# Patient Record
Sex: Male | Born: 1980 | Race: Asian | Hispanic: No | Marital: Single
Health system: Midwestern US, Community
[De-identification: ages and names within clinical notes are randomized; demographics above are authoritative.]

## PROBLEM LIST (undated history)

## (undated) DIAGNOSIS — I10 Essential (primary) hypertension: Secondary | ICD-10-CM

## (undated) DIAGNOSIS — E7800 Pure hypercholesterolemia, unspecified: Secondary | ICD-10-CM

---

## 2016-09-11 ENCOUNTER — Ambulatory Visit: Admit: 2016-09-11 | Discharge: 2016-09-11 | Payer: BLUE CROSS/BLUE SHIELD | Attending: Family Medicine

## 2016-09-11 DIAGNOSIS — I10 Essential (primary) hypertension: Secondary | ICD-10-CM

## 2016-09-11 MED ORDER — CIPROFLOXACIN-HYDROCORTISONE 0.2 %-1 % EAR DROPS, SUSP
Freq: Two times a day (BID) | OTIC | 0 refills | Status: AC
Start: 2016-09-11 — End: ?

## 2016-09-11 MED ORDER — LISINOPRIL-HYDROCHLOROTHIAZIDE 10 MG-12.5 MG TAB
ORAL_TABLET | Freq: Every day | ORAL | 5 refills | Status: AC
Start: 2016-09-11 — End: ?

## 2016-09-11 MED ORDER — SIMVASTATIN 20 MG TAB
20 mg | ORAL_TABLET | Freq: Every evening | ORAL | 5 refills | Status: AC
Start: 2016-09-11 — End: ?

## 2016-09-11 NOTE — Progress Notes (Signed)
HISTORY OF PRESENT ILLNESS  Mark Gibbs is a 35 y.o. male.  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   HPI    Review of Systems   Constitutional: Negative.  Negative for chills, fever, malaise/fatigue and weight loss.   HENT: Positive for ear pain. Negative for congestion, ear discharge, hearing loss, nosebleeds, sinus pain, sore throat and tinnitus.    Eyes: Negative for blurred vision, double vision, photophobia, pain and redness.   Respiratory: Negative for cough, hemoptysis, sputum production, shortness of breath, wheezing and stridor.    Cardiovascular: Negative for chest pain, palpitations, orthopnea, claudication, leg swelling and PND.   Gastrointestinal: Negative for abdominal pain, blood in stool, constipation, diarrhea, heartburn, melena, nausea and vomiting.   Genitourinary: Negative for dysuria, flank pain, frequency, hematuria and urgency.   Musculoskeletal: Negative for back pain, falls, joint pain, myalgias and neck pain.   Skin: Negative.  Negative for rash.   Neurological: Negative for dizziness, tingling, tremors, sensory change, speech change, focal weakness, seizures, loss of consciousness, weakness and headaches.   Endo/Heme/Allergies: Negative for polydipsia. Does not bruise/bleed easily.   Psychiatric/Behavioral: Negative for depression, hallucinations, memory loss, substance abuse and suicidal ideas. The patient is not nervous/anxious and does not have insomnia.        Physical Exam   Constitutional: He is oriented to person, place, and time. He appears well-developed. No distress.   HENT:    Head: Normocephalic and atraumatic.   Right Ear: External ear normal.   Left Ear: External ear normal.   Nose: Nose normal.   Mouth/Throat: Oropharynx is clear and moist.   External auditory canals are both congested using Q-tip mild otitis externa bilateral   Eyes: EOM are normal. Pupils are equal, round, and reactive to light. Left eye exhibits no discharge. No scleral icterus.   Neck: Normal range of motion. No JVD present. No tracheal deviation present. No thyromegaly present.   Cardiovascular: Normal rate and normal heart sounds.  Exam reveals no friction rub.    No murmur heard.  Pulmonary/Chest: Breath sounds normal. No respiratory distress. He has no wheezes. He has no rales. He exhibits no tenderness.   Abdominal: Bowel sounds are normal. He exhibits no distension and no mass. There is no tenderness. There is no rebound and no guarding.   Genitourinary: No penile tenderness.   Musculoskeletal: Normal range of motion. He exhibits no edema, tenderness or deformity.   Lymphadenopathy:     He has no cervical adenopathy.   Neurological: He is alert and oriented to person, place, and time. No cranial nerve deficit. He exhibits normal muscle tone. Coordination normal.   Skin: No rash noted. No erythema. No pallor.   Psychiatric: He has a normal mood and affect. His behavior is normal. Judgment and thought content normal.   Nursing note and vitals reviewed.      ASSESSMENT and PLAN multiple medical problems as below started lisinopril hydrochlorothiazide discussed risk factor management for coronary artery disease aspirin for chemoprophylaxis, optimize blood pressure cholesterol and LDL less than 130 diet exercise BMI 26, increase  fluids bed to table his exercise, , mild otitis externa advised against using Q-tips otic drops as below, flu shot today, he is in general good health to schedule physical lab test declined today,     ICD-10-CM ICD-9-CM    1. Hypertension, benign I10 401.1 lisinopril-hydroCHLOROthiazide  (PRINZIDE, ZESTORETIC) 10-12.5 mg per tablet   2. Hyperlipidemia, unspecified hyperlipidemia type E78.5 272.4 simvastatin (ZOCOR) 20 mg tablet   3. Over weight E66.3 278.02    4. Acute otitis externa of both ears, unspecified type H60.503 380.10 ciprofloxacin-hydrocortisone (CIPRO HC OTIC) otic suspension   5. Encounter for immunization Z23 V03.89 INFLUENZA, INJECTABLE, MDCK, PRESERVATIVE FREE, QUADRIVALENT      PR IMMUNIZ ADMIN,1 SINGLE/COMB VAC/TOXOID

## 2016-09-11 NOTE — Patient Instructions (Signed)
Take one aspirin every day 81 mg a day, can reduce risk of heart attack strokes, colon cancer .      Increase fruits vegetables, fiber, whole grains, beans, exercise,     once a day multivitamin such as Centrum silver store brand, due to benefit of folic acid vitamin D, has already mineral vitamin is recommended doses.  Multiple different vitamins not recommended may carry increased risk, including vitamin E

## 2016-09-11 NOTE — Progress Notes (Signed)
FLU VACCINE CONSENT    Patient received flu vaccine: accepted  Patient gives verbal consent: YES  Do you have Serious allergy to eggs: NO  Allergies reviewed: YES  Have you ever had a serious adverse reaction to Flu vaccine: NO  Have you ever had Guillain-Barre Syndrome (a type of temporary severe muscle weakness) within 6 weeks of receiving a flu vaccine: NO

## 2016-09-14 NOTE — Telephone Encounter (Signed)
Mr. Mark Gibbs has a question regarding the difference in dosage, he use to get Lisinopril 20mg  and the one he just got is 10/12.5 would like a call back on why.

## 2021-07-28 ENCOUNTER — Encounter: Payer: Self-pay | Admitting: Emergency Medicine

## 2021-07-28 ENCOUNTER — Ambulatory Visit
Admission: EM | Admit: 2021-07-28 | Discharge: 2021-07-28 | Disposition: A | Discharge status: Home / Self Care | Payer: BC Managed Care – PPO

## 2021-07-28 ENCOUNTER — Other Ambulatory Visit: Payer: Self-pay

## 2021-07-28 DIAGNOSIS — T464X5A Adverse effect of angiotensin-converting-enzyme inhibitors, initial encounter: Secondary | ICD-10-CM | POA: Diagnosis not present

## 2021-07-28 DIAGNOSIS — T783XXA Angioneurotic edema, initial encounter: Secondary | ICD-10-CM | POA: Diagnosis not present

## 2021-07-28 HISTORY — DX: Essential (primary) hypertension: I10

## 2021-07-28 HISTORY — DX: Pure hypercholesterolemia, unspecified: E78.00

## 2021-07-28 MED ORDER — PREDNISONE 20 MG PO TABS: 40.0000 mg | ORAL_TABLET | Freq: Every day | ORAL | 0 refills | Status: AC

## 2021-07-28 MED ORDER — AMLODIPINE BESYLATE 5 MG PO TABS: 5.0000 mg | ORAL_TABLET | Freq: Every day | ORAL | 0 refills | Status: DC

## 2021-07-28 NOTE — Discharge Instructions (Addendum)
Stop lisinopril Switch to amlodipine daily for blood pressure Follow-up with primary care Begin prednisone 40 mg daily to help with swelling Cool compresses to lip Please ensure swelling diminishing over the next 48 hours Please go to emergency room if developing worsening swelling, tongue throat swelling or any difficulty breathing

## 2021-07-28 NOTE — ED Provider Notes (Signed)
[facility]    CSN: [id number] Arrival date & time: 07/28/21  1408      History   Chief Complaint Chief Complaint  Patient presents with   Allergic Reaction    HPI Albert Davis is a 40 y.o. male history of hypertension, hyperlipidemia presenting today for evaluation of lip swelling.  Reports that this morning he ate a protein shake and took his daily lisinopril and statin and 1 hour later began to develop upper lip swelling which has progressed to encompass the entire lip.  Denies any oral, tongue or throat swelling.  Denies difficulty breathing.  Denies associated pain or itching associated with swelling.  Denies other rash.  Reports protein shake this morning he has taken before without problem.  Has been on lisinopril for approximately 6 to 7 years.  HPI  Past Medical History:  Diagnosis Date   Hypercholesteremia    Hypertension     There are no problems to display for this patient.   History reviewed. No pertinent surgical history.     Home Medications    Prior to Admission medications   Medication Sig Start Date End Date Taking? Authorizing Provider  amLODipine (NORVASC) 5 MG tablet Take 1 tablet (5 mg total) by mouth daily. 07/28/21  Yes Vaidehi Braddy C, PA-C  atorvastatin (LIPITOR) 10 MG tablet Take 10 mg by mouth daily.   Yes [provider]  predniSONE (DELTASONE) 20 MG tablet Take 2 tablets (40 mg total) by mouth daily with breakfast for 5 days. 07/28/21 08/02/21 Yes Jatinder Mcdonagh, Junius Creamer, PA-C    Family History Family History  Problem Relation Age of Onset   Healthy Mother    Healthy Father     Social History Social History   Tobacco Use   Smoking status: Every Day    Packs/day: 0.50    Types: Cigarettes   Smokeless tobacco: Never  Substance Use Topics   Alcohol use: Yes    Comment: couple of times per week   Drug use: Never     Allergies   Lisinopril   Review of Systems Review of Systems  Constitutional:  Negative for  fatigue and fever.  HENT:  Positive for facial swelling.   Eyes:  Negative for redness, itching and visual disturbance.  Respiratory:  Negative for shortness of breath.   Cardiovascular:  Negative for chest pain and leg swelling.  Gastrointestinal:  Negative for nausea and vomiting.  Musculoskeletal:  Negative for arthralgias and myalgias.  Skin:  Negative for color change, rash and wound.  Neurological:  Negative for dizziness, syncope, weakness, light-headedness and headaches.    Physical Exam Triage Vital Signs ED Triage Vitals  Enc Vitals Group     BP 07/28/21 1447 (!) 136/100     Pulse Rate 07/28/21 1447 (!) 104     Resp 07/28/21 1447 18     Temp 07/28/21 1447 98.2 F (36.8 C)     Temp Source 07/28/21 1447 Oral     SpO2 07/28/21 1447 98 %     Weight --      Height --      Head Circumference --      Peak Flow --      Pain Score 07/28/21 1448 4     Pain Loc --      Pain Edu? --      Excl. in GC? --    No data found.  Updated Vital Signs BP (!) 136/100 (BP Location: Right Arm)   Pulse Marland Kitchen)  104   Temp 98.2 F (36.8 C) (Oral)   Resp 18   SpO2 98%   Visual Acuity Right Eye Distance:   Left Eye Distance:   Bilateral Distance:    Right Eye Near:   Left Eye Near:    Bilateral Near:     Physical Exam Vitals and nursing note reviewed.  Constitutional:      Appearance: He is well-developed.     Comments: No acute distress  HENT:     Head: Normocephalic and atraumatic.     Nose: Nose normal.     Mouth/Throat:     Comments: Upper lip swelling, no involvement of lower lip, no other facial swelling noted, no soft palate swelling, uvula midline without swelling, posterior pharynx patent, no tongue swelling Eyes:     Conjunctiva/sclera: Conjunctivae normal.  Cardiovascular:     Rate and Rhythm: Normal rate.  Pulmonary:     Effort: Pulmonary effort is normal. No respiratory distress.     Comments: Breathing comfortably at rest, CTABL, no wheezing, rales or other  adventitious sounds auscultated  Speaking in full sentences Abdominal:     General: There is no distension.  Musculoskeletal:        General: Normal range of motion.     Cervical back: Neck supple.  Skin:    General: Skin is warm and dry.  Neurological:     Mental Status: He is alert and oriented to person, place, and time.     UC Treatments / Results  Labs (all labs ordered are listed, but only abnormal results are displayed) Labs Reviewed - No data to display  EKG   Radiology No results found.  Procedures Procedures (including critical care time)  Medications Ordered in UC Medications - No data to display  Initial Impression / Assessment and Plan / UC Course  I have reviewed the triage vital signs and the nursing notes.  Pertinent labs & imaging results that were available during my care of the patient were reviewed by me and considered in my medical decision making (see chart for details).     Suspect likely ACE inhibitor induced angioedema-likely bradykinin pathway versus histamine/mast cell.  Will provide prednisone to be safe, but discussed with patient stopping ACE inhibitor, switch to amlodipine as alternative for hypertension and monitor for gradual resolution over time.  No further airway involvement or airway compromise at this time.  Patient instructed to go to emergency room if developing the symptoms.  Discussed strict return precautions. Patient verbalized understanding and is agreeable with plan.  Final Clinical Impressions(s) / UC Diagnoses   Final diagnoses:  Angioedema due to angiotensin converting enzyme inhibitor (ACE-I)     Discharge Instructions      Stop lisinopril Switch to amlodipine daily for blood pressure Follow-up with primary care Begin prednisone 40 mg daily to help with swelling Cool compresses to lip Please ensure swelling diminishing over the next 48 hours Please go to emergency room if developing worsening swelling, tongue  throat swelling or any difficulty breathing     ED Prescriptions     Medication Sig Dispense Auth. Provider   amLODipine (NORVASC) 5 MG tablet Take 1 tablet (5 mg total) by mouth daily. 90 tablet Damier Disano C, PA-C   predniSONE (DELTASONE) 20 MG tablet Take 2 tablets (40 mg total) by mouth daily with breakfast for 5 days. 10 tablet Christofer Shen, [place] C, PA-C      PDMP not reviewed this encounter.   Tyresse Jayson, Jordan C, PA-C 07/28/21  1555  

## 2021-07-28 NOTE — ED Triage Notes (Signed)
Patient presents to [facility] for evaluation of lip swelling after taking his lisinopril this omrning.  At this time, he denies any SOB, tingling or numbness in his mouth, throat closing sensation.  Does c/o right sided headache/

## 2022-01-07 ENCOUNTER — Other Ambulatory Visit: Payer: Self-pay

## 2022-01-07 ENCOUNTER — Encounter (HOSPITAL_BASED_OUTPATIENT_CLINIC_OR_DEPARTMENT_OTHER): Payer: Self-pay | Admitting: Family Medicine

## 2022-01-07 ENCOUNTER — Ambulatory Visit (INDEPENDENT_AMBULATORY_CARE_PROVIDER_SITE_OTHER): Payer: BC Managed Care – PPO | Admitting: Family Medicine

## 2022-01-07 DIAGNOSIS — G4700 Insomnia, unspecified: Secondary | ICD-10-CM | POA: Diagnosis not present

## 2022-01-07 DIAGNOSIS — F17200 Nicotine dependence, unspecified, uncomplicated: Secondary | ICD-10-CM

## 2022-01-07 DIAGNOSIS — I10 Essential (primary) hypertension: Secondary | ICD-10-CM

## 2022-01-07 DIAGNOSIS — N529 Male erectile dysfunction, unspecified: Secondary | ICD-10-CM | POA: Diagnosis not present

## 2022-01-07 DIAGNOSIS — E785 Hyperlipidemia, unspecified: Secondary | ICD-10-CM | POA: Diagnosis not present

## 2022-01-07 NOTE — Assessment & Plan Note (Signed)
We will continue with rosuvastatin at this time Will check lipid panel to assess control

## 2022-01-07 NOTE — Assessment & Plan Note (Signed)
Discussed potential causes and treatments Provided sleep hygiene guidelines handout and reviewed general measures with patient Can consider initiation of melatonin 3 mg to 5 mg nightly 30 minutes before bedtime Also feel that CBT would be beneficial for patient, does report that he is seeing a therapist currently, encouraged him to discuss this with them.  If needing referral to alternative therapist in order to treat, advised that he contact us

## 2022-01-07 NOTE — Assessment & Plan Note (Signed)
Currently smoking about 1 to 2 cigarettes/day, did smoke more in the past Discussed importance of smoking cessation, risk with continued smoking including its impact on chronic medical conditions as well as risk of developing future medical issues such as cancer, heart disease Continue to assess readiness to quit at future visits Can consider something such as Chantix to help with quitting, however would need to achieve better blood pressure control before initiation

## 2022-01-07 NOTE — Progress Notes (Signed)
New Patient Office Visit  Subjective:  Patient ID: Albert Davis, male    DOB: 1981/10/21  Age: 41 y.o. MRN: [id number]  CC:  Chief Complaint  Patient presents with   Establish Care    Prior PCP in [place]   Medication Refill    Patient presents to have medications refilled. He has no other acute concerns or complaints today    HPI Albert Davis is a 41 year old male presenting to establish in clinic.  Current concerns as outlined above.  Past medical history significant for hypertension, hyperlipidemia.  On PHQ-9, patient does indicate notable issues with sleep.  At the end of visit, patient also indicates concerns related to erectile dysfunction.  Hypertension: Reports that this was diagnosed many years ago, he thinks as a child.  At one point he was on lisinopril in the past, however had relatively recent issue with angioedema and this was discontinued.  Has been taking amlodipine now for at least 3 months, tolerating well.  Will check blood pressure occasionally at home, reports systolic readings in the [id number] to 140s.  Denies any current issues related to chest pain, headaches, lightheadedness or dizziness.  Hyperlipidemia: Was taking atorvastatin 1 point the past, now taking rosuvastatin.  Has been tolerating well, does not have any myalgias.  Insomnia: Indicates that this is been going on for couple years, began around time of pandemic.  Primarily reports issues with sleep initiation and indicates that he will at times have racing thoughts while trying to sleep at night.  Erectile dysfunction: Near conclusion of visit, patient did mention erectile dysfunction as an additional issue that he has been dealing with he has questions about.  Does report that his wife was diagnosed with cancer, has been responding well to treatment.  He does report that they were both undergoing counseling and therapy related to this life stressor.  Patient is originally from [place], [place].  Most recently he was living in [place] and moved to the area here in April 2022.  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  Outside of work he does enjoy going to the gym.  Past Medical History:  Diagnosis Date   Hypercholesteremia    Hypertension     History reviewed. No pertinent surgical history.  Family History  Problem Relation Age of Onset   Healthy Mother    Healthy Father     Social History   Socioeconomic History   Marital status: Married    Spouse name: Not on file   Number of children: Not on file   Years of education: Not on file   Highest education level: Not on file  Occupational History   Not on file  Tobacco Use   Smoking status: Every Day    Packs/day: 0.50    Types: Cigarettes   Smokeless tobacco: Never  Vaping Use   Vaping Use: Never used  Substance and Sexual Activity   Alcohol use: Yes    Alcohol/week: 3.0 - 4.0 standard drinks    Types: 3 - 4 Shots of liquor per week    Comment: 3-4 on weekends   Drug use: Never   Sexual activity: Not Currently  Other Topics Concern   Not on file  Social History Narrative   Not on file   Social Determinants of Health   Financial Resource Strain: Not on file  Food Insecurity: Not on file  Transportation Needs: Not on file  Physical Activity:  Not on file  Stress: Not on file  Social Connections: Not on file  Intimate Partner Violence: Not on file    Objective:   Today's Vitals: BP (!) 170/112    Pulse (!) 109    Ht 5\' 9"  (1.753 m)    Wt 160 lb (72.6 kg)    SpO2 98%    BMI 23.63 kg/m   Physical Exam  41 year old male in no acute distress Cardiovascular exam with regular rate and rhythm, no murmur appreciated Lungs clear to auscultation bilaterally  Assessment & Plan:   Problem List Items Addressed This Visit       Cardiovascular and Mediastinum   Primary hypertension    Blood pressure not at goal in office today Reports borderline control at home Will need to  continue to monitor closely: At present can continue with amlodipine, monitoring at home Recommend DASH diet, regular aerobic exercise If blood pressure continues to remain elevated, consider addition of additional medication to help control blood pressure      Relevant Medications   rosuvastatin (CRESTOR) 20 MG tablet   Other Relevant Orders   CBC with Differential/Platelet   Comprehensive metabolic panel   Hemoglobin A1c   TSH Rfx on Abnormal to Free T4     Other   Hyperlipidemia    We will continue with rosuvastatin at this time Will check lipid panel to assess control      Relevant Medications   rosuvastatin (CRESTOR) 20 MG tablet   Other Relevant Orders   Lipid panel   Insomnia    Discussed potential causes and treatments Provided sleep hygiene guidelines handout and reviewed general measures with patient Can consider initiation of melatonin 3 mg to 5 mg nightly 30 minutes before bedtime Also feel that CBT would be beneficial for patient, does report that he is seeing a therapist currently, encouraged him to discuss this with them.  If needing referral to alternative therapist in order to treat, advised that he contact us      Relevant Orders   TSH Rfx on Abnormal to Free T4   Erectile dysfunction    Mention to me at end of visit, did discuss that erectile dysfunction can have many causes including vascular, structural, psychogenic, medication, chronic health condition.  Initially, will check labs related to chronic medical conditions to assess current control Will need to discuss further at next visit, will complete questionnaire at next visit      Tobacco use disorder    Currently smoking about 1 to 2 cigarettes/day, did smoke more in the past Discussed importance of smoking cessation, risk with continued smoking including its impact on chronic medical conditions as well as risk of developing future medical issues such as cancer, heart disease Continue to assess  readiness to quit at future visits Can consider something such as Chantix to help with quitting, however would need to achieve better blood pressure control before initiation       Outpatient Encounter Medications as of 01/07/2022  Medication Sig   amLODipine (NORVASC) 5 MG tablet Take 1 tablet (5 mg total) by mouth daily.   rosuvastatin (CRESTOR) 20 MG tablet Take 20 mg by mouth daily.   [DISCONTINUED] atorvastatin (LIPITOR) 10 MG tablet Take 10 mg by mouth daily.   No facility-administered encounter medications on file as of 01/07/2022.   Spent 60 minutes on this patient encounter, including preparation, chart review, face-to-face counseling with patient and coordination of care, and documentation of encounter  Follow-up: Return in about  4 weeks (around 02/04/2022).  Plan for close follow-up in about 3 to 4 weeks to review labs, follow-up on chronic medical issues, discuss ED concerns  Sammye Staff J [place], MD

## 2022-01-07 NOTE — Patient Instructions (Signed)
°  Medication Instructions:  °Your physician recommends that you continue on your current medications as directed. Please refer to the Current Medication list given to you today. °--If you need a refill on any your medications before your next appointment, please call your pharmacy first. If no refills are authorized on file call the office.-- °Lab Work: °Your physician has recommended that you have lab work today: CBC, CMP, Lipid, A1C, and TSH °If you have labs (blood work) drawn today and your tests are completely normal, you will receive your results via MyChart message OR a phone call from our staff.  °Please ensure you check your voicemail in the event that you authorized detailed messages to be left on a delegated number. If you have any lab test that is abnormal or we need to change your treatment, we will call you to review the results. ° °Follow-Up: °Your next appointment:   °Your physician recommends that you schedule a follow-up appointment in: 3-4 WEEKS with Dr. de Cuba ° °You will receive a text message or e-mail with a link to a survey about your care and experience with us today! We would greatly appreciate your feedback!  ° °Thanks for letting us be apart of your health journey!!  °Primary Care and Sports Medicine  ° °Dr. Raymond de Cuba  ° °We encourage you to activate your patient portal called "MyChart".  Sign up information is provided on this After Visit Summary.  MyChart is used to connect with patients for Virtual Visits (Telemedicine).  Patients are able to view lab/test results, encounter notes, upcoming appointments, etc.  Non-urgent messages can be sent to your provider as well. To learn more about what you can do with MyChart, please visit --  [email or web address]   ° °

## 2022-01-07 NOTE — Assessment & Plan Note (Signed)
Mention to me at end of visit, did discuss that erectile dysfunction can have many causes including vascular, structural, psychogenic, medication, chronic health condition.  Initially, will check labs related to chronic medical conditions to assess current control Will need to discuss further at next visit, will complete questionnaire at next visit

## 2022-01-07 NOTE — Assessment & Plan Note (Signed)
Blood pressure not at goal in office today Reports borderline control at home Will need to continue to monitor closely: At present can continue with amlodipine, monitoring at home Recommend DASH diet, regular aerobic exercise If blood pressure continues to remain elevated, consider addition of additional medication to help control blood pressure

## 2022-01-08 LAB — CBC WITH DIFFERENTIAL/PLATELET
Basophils Absolute: 0.1 10*3/uL (ref 0.0–0.2)
Basos: 1 %
EOS (ABSOLUTE): 0 10*3/uL (ref 0.0–0.4)
Eos: 0 %
Hematocrit: 50.9 % (ref 37.5–51.0)
Hemoglobin: 18.1 g/dL — ABNORMAL HIGH (ref 13.0–17.7)
Immature Grans (Abs): 0.1 10*3/uL (ref 0.0–0.1)
Immature Granulocytes: 1 %
Lymphocytes Absolute: 2.2 10*3/uL (ref 0.7–3.1)
Lymphs: 18 %
MCH: 32 pg (ref 26.6–33.0)
MCHC: 35.6 g/dL (ref 31.5–35.7)
MCV: 90 fL (ref 79–97)
Monocytes Absolute: 1.2 10*3/uL — ABNORMAL HIGH (ref 0.1–0.9)
Monocytes: 10 %
Neutrophils Absolute: 8.5 10*3/uL — ABNORMAL HIGH (ref 1.4–7.0)
Neutrophils: 70 %
Platelets: 279 10*3/uL (ref 150–450)
RBC: 5.66 x10E6/uL (ref 4.14–5.80)
RDW: 11.9 % (ref 11.6–15.4)
WBC: 12.1 10*3/uL — ABNORMAL HIGH (ref 3.4–10.8)

## 2022-01-08 LAB — COMPREHENSIVE METABOLIC PANEL
ALT: 62 IU/L — ABNORMAL HIGH (ref 0–44)
AST: 53 IU/L — ABNORMAL HIGH (ref 0–40)
Albumin/Globulin Ratio: 1.5 (ref 1.2–2.2)
Albumin: 5.1 g/dL — ABNORMAL HIGH (ref 4.0–5.0)
Alkaline Phosphatase: 105 IU/L (ref 44–121)
BUN/Creatinine Ratio: 8 — ABNORMAL LOW (ref 9–20)
BUN: 7 mg/dL (ref 6–20)
Bilirubin Total: 0.7 mg/dL (ref 0.0–1.2)
CO2: 21 mmol/L (ref 20–29)
Calcium: 9.8 mg/dL (ref 8.7–10.2)
Chloride: 97 mmol/L (ref 96–106)
Creatinine, Ser: 0.88 mg/dL (ref 0.76–1.27)
Globulin, Total: 3.3 g/dL (ref 1.5–4.5)
Glucose: 86 mg/dL (ref 70–99)
Potassium: 4.4 mmol/L (ref 3.5–5.2)
Sodium: 138 mmol/L (ref 134–144)
Total Protein: 8.4 g/dL (ref 6.0–8.5)
eGFR: 112 mL/min/{1.73_m2} (ref >59)

## 2022-01-08 LAB — LIPID PANEL
Chol/HDL Ratio: 2.7 ratio (ref 0.0–5.0)
Cholesterol, Total: 248 mg/dL — ABNORMAL HIGH (ref 100–199)
HDL: 91 mg/dL (ref >39)
LDL Chol Calc (NIH): 139 mg/dL — ABNORMAL HIGH (ref 0–99)
Triglycerides: 104 mg/dL (ref 0–149)
VLDL Cholesterol Cal: 18 mg/dL (ref 5–40)

## 2022-01-08 LAB — HEMOGLOBIN A1C
Est. average glucose Bld gHb Est-mCnc: 117 mg/dL
Hgb A1c MFr Bld: 5.7 % — ABNORMAL HIGH (ref 4.8–5.6)

## 2022-01-08 LAB — TSH RFX ON ABNORMAL TO FREE T4: TSH: 2.78 u[IU]/mL (ref 0.450–4.500)

## 2022-01-09 ENCOUNTER — Telehealth (HOSPITAL_BASED_OUTPATIENT_CLINIC_OR_DEPARTMENT_OTHER): Payer: Self-pay

## 2022-01-09 DIAGNOSIS — R7401 Elevation of levels of liver transaminase levels: Secondary | ICD-10-CM

## 2022-01-09 MED ORDER — ROSUVASTATIN CALCIUM 20 MG PO TABS: 20.0000 mg | ORAL_TABLET | Freq: Every day | ORAL | 0 refills | Status: DC

## 2022-01-09 MED ORDER — AMLODIPINE BESYLATE 5 MG PO TABS: 5.0000 mg | ORAL_TABLET | Freq: Every day | ORAL | 0 refills | Status: DC

## 2022-01-09 NOTE — Telephone Encounter (Signed)
Patient is aware and agreeable to lab results and recommendations Will fax orders to [facility] for additional labs Order placed for RUQ [place].

## 2022-01-09 NOTE — Telephone Encounter (Signed)
-----   Message from Hosie Poisson [place], MD sent at 01/08/2022  8:07 AM EST ----- White blood cell count slightly elevated with corresponding slight increase in neutrophils, suspect this may be related to recent illness, white blood cell count can also be affected by smoking.  Would plan to recheck CBC around time of next office visit.  Electrolytes and kidney function are normal.  Liver enzymes are slightly elevated, will check for any viral causes or iron storage issues (diagnosis code: Transaminitis).  Will also check right upper quadrant ultrasound.  Thyroid function is normal.  Hemoglobin A1c which measures approximately 81-month average of blood sugars is slightly elevated at 5.7%.  This falls within "prediabetes" range.  This finding indicates an increased risk of developing diabetes in the future.  Primary recommendations are for lifestyle modifications including dietary changes and increase in weekly physical activity. Eventual goal should be started about 150 minutes/week of moderate intensity aerobic exercise.  Lipid panel with elevated total cholesterol and elevated "bad" cholesterol.  Would initially focus on lifestyle modifications to address cholesterol issues - including dietary changes such as incorporating fresh fruits and vegetables, lean protein in the diet and reducing consumption of red meats, saturated fats, processed foods.  Recommend regular physical activity as discussed above.  Can discuss lab results at follow-up visit.

## 2022-02-06 ENCOUNTER — Other Ambulatory Visit: Payer: BC Managed Care – PPO

## 2022-02-08 ENCOUNTER — Other Ambulatory Visit (HOSPITAL_BASED_OUTPATIENT_CLINIC_OR_DEPARTMENT_OTHER): Payer: Self-pay | Admitting: Family Medicine

## 2022-03-06 ENCOUNTER — Ambulatory Visit
Admission: RE | Admit: 2022-03-06 | Discharge: 2022-03-06 | Disposition: A | Discharge status: Home / Self Care | Payer: BC Managed Care – PPO | Source: Ambulatory Visit | Attending: Family Medicine | Admitting: Family Medicine

## 2022-03-06 DIAGNOSIS — R7401 Elevation of levels of liver transaminase levels: Secondary | ICD-10-CM

## 2022-03-06 DIAGNOSIS — R748 Abnormal levels of other serum enzymes: Secondary | ICD-10-CM | POA: Diagnosis not present

## 2022-03-11 ENCOUNTER — Other Ambulatory Visit (HOSPITAL_BASED_OUTPATIENT_CLINIC_OR_DEPARTMENT_OTHER): Payer: Self-pay | Admitting: Family Medicine

## 2022-04-11 ENCOUNTER — Emergency Department (HOSPITAL_COMMUNITY): Payer: BC Managed Care – PPO

## 2022-04-11 ENCOUNTER — Encounter (HOSPITAL_COMMUNITY): Payer: Self-pay

## 2022-04-11 ENCOUNTER — Emergency Department (HOSPITAL_COMMUNITY)
Admission: EM | Admit: 2022-04-11 | Discharge: 2022-04-11 | Disposition: A | Discharge status: Home / Self Care | Payer: BC Managed Care – PPO | Attending: Emergency Medicine | Admitting: Emergency Medicine

## 2022-04-11 ENCOUNTER — Other Ambulatory Visit: Payer: Self-pay

## 2022-04-11 DIAGNOSIS — I10 Essential (primary) hypertension: Secondary | ICD-10-CM | POA: Insufficient documentation

## 2022-04-11 DIAGNOSIS — R7989 Other specified abnormal findings of blood chemistry: Secondary | ICD-10-CM | POA: Diagnosis not present

## 2022-04-11 DIAGNOSIS — R569 Unspecified convulsions: Secondary | ICD-10-CM | POA: Insufficient documentation

## 2022-04-11 DIAGNOSIS — R7401 Elevation of levels of liver transaminase levels: Secondary | ICD-10-CM | POA: Insufficient documentation

## 2022-04-11 DIAGNOSIS — Z79899 Other long term (current) drug therapy: Secondary | ICD-10-CM | POA: Insufficient documentation

## 2022-04-11 DIAGNOSIS — D696 Thrombocytopenia, unspecified: Secondary | ICD-10-CM | POA: Diagnosis not present

## 2022-04-11 DIAGNOSIS — R55 Syncope and collapse: Secondary | ICD-10-CM | POA: Diagnosis not present

## 2022-04-11 DIAGNOSIS — E871 Hypo-osmolality and hyponatremia: Secondary | ICD-10-CM | POA: Diagnosis not present

## 2022-04-11 LAB — COMPREHENSIVE METABOLIC PANEL
ALT: 66 U/L — ABNORMAL HIGH (ref 0–44)
AST: 68 U/L — ABNORMAL HIGH (ref 15–41)
Albumin: 4.2 g/dL (ref 3.5–5.0)
Alkaline Phosphatase: 59 U/L (ref 38–126)
Anion gap: 11 (ref 5–15)
BUN: 6 mg/dL (ref 6–20)
CO2: 19 mmol/L — ABNORMAL LOW (ref 22–32)
Calcium: 8.8 mg/dL — ABNORMAL LOW (ref 8.9–10.3)
Chloride: 103 mmol/L (ref 98–111)
Creatinine, Ser: 1.13 mg/dL (ref 0.61–1.24)
GFR, Estimated: >60 mL/min (ref >60)
Glucose, Bld: 147 mg/dL — ABNORMAL HIGH (ref 70–99)
Potassium: 3.7 mmol/L (ref 3.5–5.1)
Sodium: 133 mmol/L — ABNORMAL LOW (ref 135–145)
Total Bilirubin: 1 mg/dL (ref 0.3–1.2)
Total Protein: 7.4 g/dL (ref 6.5–8.1)

## 2022-04-11 LAB — CBC WITH DIFFERENTIAL/PLATELET
Abs Immature Granulocytes: 0.02 10*3/uL (ref 0.00–0.07)
Basophils Absolute: 0 10*3/uL (ref 0.0–0.1)
Basophils Relative: 0 %
Eosinophils Absolute: 0 10*3/uL (ref 0.0–0.5)
Eosinophils Relative: 0 %
HCT: 46.3 % (ref 39.0–52.0)
Hemoglobin: 16.4 g/dL (ref 13.0–17.0)
Immature Granulocytes: 0 %
Lymphocytes Relative: 8 %
Lymphs Abs: 0.4 10*3/uL — ABNORMAL LOW (ref 0.7–4.0)
MCH: 32.5 pg (ref 26.0–34.0)
MCHC: 35.4 g/dL (ref 30.0–36.0)
MCV: 91.9 fL (ref 80.0–100.0)
Monocytes Absolute: 0.5 10*3/uL (ref 0.1–1.0)
Monocytes Relative: 10 %
Neutro Abs: 4.3 10*3/uL (ref 1.7–7.7)
Neutrophils Relative %: 82 %
Platelets: 135 10*3/uL — ABNORMAL LOW (ref 150–400)
RBC: 5.04 MIL/uL (ref 4.22–5.81)
RDW: 12 % (ref 11.5–15.5)
WBC: 5.3 10*3/uL (ref 4.0–10.5)
nRBC: 0 % (ref 0.0–0.2)

## 2022-04-11 LAB — MAGNESIUM: Magnesium: 2.4 mg/dL (ref 1.7–2.4)

## 2022-04-11 LAB — RAPID URINE DRUG SCREEN, HOSP PERFORMED
Amphetamines: NOT DETECTED
Barbiturates: NOT DETECTED
Benzodiazepines: NOT DETECTED
Cocaine: NOT DETECTED
Opiates: NOT DETECTED
Tetrahydrocannabinol: NOT DETECTED

## 2022-04-11 MED ORDER — LORAZEPAM 2 MG/ML IJ SOLN
1.0000 mg | Freq: Once | INTRAMUSCULAR | Status: AC
  Administered 2022-04-11: 1 mg via INTRAVENOUS
  Filled 2022-04-11: qty 1

## 2022-04-11 MED ORDER — LORAZEPAM 1 MG PO TABS: 1.0000 mg | ORAL_TABLET | Freq: Every day | ORAL | 0 refills | Status: AC | PRN

## 2022-04-11 MED ORDER — LORAZEPAM 2 MG/ML IJ SOLN
0.5000 mg | Freq: Once | INTRAMUSCULAR | Status: AC
  Administered 2022-04-11: 0.5 mg via INTRAVENOUS
  Filled 2022-04-11: qty 1

## 2022-04-11 NOTE — Discharge Instructions (Signed)
Like we discussed, below is the contact information for [facility] neurology.  Please give them a call as soon as possible to schedule an appointment for reevaluation.  It is likely that you had a seizure today.  Please refrain from driving a motor vehicle until you are reevaluated by [facility] neurology. ? ?I prescribed a medication called Ativan.  You can take this as needed for worsening insomnia or increasing stress.  I think that these 2 factors likely played a role in your seizure today.  Please do not drive a car when taking this.  Please do not operate a motor vehicle when taking this. ? ?I have attached information on seizures as well.  Please reference this with any questions. ? ?If you develop any new or worsening symptoms whatsoever please come back to the emergency department for immediate reevaluation. ?

## 2022-04-11 NOTE — ED Notes (Signed)
Patient tearful and when asked if he were suicidal he hesitated. He then explained that the arguments with his wife have been really intense because he went out of the country with a friend and she thinks they are having an affair. When asked again about suicidal thoughts, patient firmly stated no. ?

## 2022-04-11 NOTE — ED Notes (Signed)
Patient to CT scan

## 2022-04-11 NOTE — ED Notes (Signed)
RN reviewed discharge instructions with pt. Pt verbalized understanding and had no further questions. VSS upon discharge.  

## 2022-04-11 NOTE — ED Triage Notes (Signed)
Patient states he has been arguing with wife for the last couple days. Also states he had some shaking this morning, like he couldn't pour coffee in a cup. He had an episode this afternoon where he had full body shaking and bit his lip. Patient doesn't remember the incident. ?

## 2022-04-11 NOTE — ED Notes (Signed)
Patient returned from CT scan. Wife at bedside

## 2022-04-11 NOTE — ED Provider Notes (Signed)
?[facility] EMERGENCY DEPARTMENT ?Provider Note ? ? ?CSN: [id number] ?Arrival date & time: 04/11/22  1608 ? ?  ? ?History ? ?No chief complaint on file. ? ? ?Albert Davis is a 41 y.o. male. ? ?HPI ?Patient is a 41 year old male with a history of hypertension, hyperlipidemia, insomnia, who presents to the emergency department due to a witnessed tonic-clonic episode.  Patient states that over the past few months he and his wife have had significant marital issues.  He states that over the past few days this has worsened significantly and they have been fighting much more than normal causing him a lot of stress.  He states that he has "not slept at all for 3 days".  Earlier today he states that he was sitting down at the computer and lost consciousness.  He does not recall any of these events.  He was told by his wife that he was "shaking".  Patient notes a bite to his tongue.  Currently reports feeling anxious and fatigued but otherwise denies any other somatic complaints.  No headaches, neck pain, back pain, chest pain, shortness of breath, bowel/bladder incontinence.  States that he had a seizure once when he was about 41 years old but has never been on antiepileptics.  He states that he used to regularly drink alcohol but has not drank any alcohol for more than a month. ? ?His wife is now at bedside.  She states that she was initially in the kitchen when she heard a noise in the other room.  She states that she saw him slide off the couch and onto the floor.  He then had a witnessed tonic-clonic episode.  She saw a small amount of bleeding from his mouth from his tongue bite.  His symptoms then resolved and he appeared confused for a short period of time before EMS arrived. ?  ? ?Home Medications ?Prior to Admission medications   ?Medication Sig Start Date End Date Taking? Authorizing Provider  ?LORazepam (ATIVAN) 1 MG tablet Take 1 tablet (1 mg total) by mouth daily as needed for anxiety or sleep.  04/11/22  Yes Placido Sou, PA-C  ?amLODipine (NORVASC) 5 MG tablet TAKE 1 TABLET (5 MG TOTAL) BY MOUTH DAILY. 03/11/22   de [place], Buren Kos, MD  ?rosuvastatin (CRESTOR) 20 MG tablet TAKE 1 TABLET BY MOUTH EVERY DAY 03/11/22   de [place], Buren Kos, MD  ?   ? ?Allergies    ?Lisinopril   ? ?Review of Systems   ?Review of Systems  ?All other systems reviewed and are negative. ?Ten systems reviewed and are negative for acute change, except as noted in the HPI.   ?Physical Exam ?Updated Vital Signs ?BP (!) 130/96   Pulse 89   Temp 98.7 ?F (37.1 ?C) (Oral)   Resp 15   Ht 5\' 9"  (1.753 m)   Wt 72.6 kg   SpO2 100%   BMI 23.63 kg/m?  ?Physical Exam ?Vitals and nursing note reviewed.  ?Constitutional:   ?   General: He is not in acute distress. ?   Appearance: Normal appearance. He is not ill-appearing, toxic-appearing or diaphoretic.  ?HENT:  ?   Head: Normocephalic and atraumatic.  ?   Right Ear: External ear normal.  ?   Left Ear: External ear normal.  ?   Nose: Nose normal.  ?   Mouth/Throat:  ?   Mouth: Mucous membranes are moist.  ?   Pharynx: Oropharynx is clear. No oropharyngeal exudate or posterior  oropharyngeal erythema.  ?   Comments: Tongue bite noted to the left lateral tongue.  No active bleeding. ?Eyes:  ?   General: No scleral icterus.    ?   Right eye: No discharge.     ?   Left eye: No discharge.  ?   Extraocular Movements: Extraocular movements intact.  ?   Conjunctiva/sclera: Conjunctivae normal.  ?   Pupils: Pupils are equal, round, and reactive to light.  ?   Comments: PERRL. EOMI.  ?Cardiovascular:  ?   Rate and Rhythm: Normal rate and regular rhythm.  ?   Pulses: Normal pulses.  ?   Heart sounds: Normal heart sounds. No murmur heard. ?  No friction rub. No gallop.  ?Pulmonary:  ?   Effort: Pulmonary effort is normal. No respiratory distress.  ?   Breath sounds: Normal breath sounds. No stridor. No wheezing, rhonchi or rales.  ?Abdominal:  ?   General: Abdomen is flat.  ?   Tenderness: There is no  abdominal tenderness.  ?Musculoskeletal:     ?   General: Normal range of motion.  ?   Cervical back: Normal range of motion and neck supple. No tenderness.  ?Skin: ?   General: Skin is warm and dry.  ?Neurological:  ?   General: No focal deficit present.  ?   Mental Status: He is alert and oriented to person, place, and time.  ?   Comments: Patient is oriented to person, place, and time. Patient phonates in clear, complete, and coherent sentences. Finger to nose intact bilaterally with no visible signs of dysmetria. Strength is 5/5 in all four extremities. Distal sensation intact in all four extremities.  ?Psychiatric:     ?   Mood and Affect: Mood normal.     ?   Behavior: Behavior normal.  ? ?ED Results / Procedures / Treatments   ?Labs ?(all labs ordered are listed, but only abnormal results are displayed) ?Labs Reviewed  ?COMPREHENSIVE METABOLIC PANEL - Abnormal; Notable for the following components:  ?    Result Value  ? Sodium 133 (*)   ? CO2 19 (*)   ? Glucose, Bld 147 (*)   ? Calcium 8.8 (*)   ? AST 68 (*)   ? ALT 66 (*)   ? All other components within normal limits  ?CBC WITH DIFFERENTIAL/PLATELET - Abnormal; Notable for the following components:  ? Platelets 135 (*)   ? Lymphs Abs 0.4 (*)   ? All other components within normal limits  ?RAPID URINE DRUG SCREEN, HOSP PERFORMED  ?MAGNESIUM  ?ETHANOL  ? ?EKG ?None ? ?Radiology ?CT HEAD WO CONTRAST (5MM) ? ?Result Date: 04/11/2022 ?CLINICAL DATA:  Syncope EXAM: CT HEAD WITHOUT CONTRAST TECHNIQUE: Contiguous axial images were obtained from the base of the skull through the vertex without intravenous contrast. RADIATION DOSE REDUCTION: This exam was performed according to the departmental dose-optimization program which includes automated exposure control, adjustment of the mA and/or kV according to patient size and/or use of iterative reconstruction technique. COMPARISON:  None Available. FINDINGS: Brain: No evidence of acute infarction, hemorrhage,  hydrocephalus, extra-axial collection or mass lesion/mass effect. Vascular: No hyperdense vessel or unexpected calcification. Skull: Normal. Negative for fracture or focal lesion. Sinuses/Orbits: Mucosal thickening of the ethmoid sinuses. No acute finding. Other: None. IMPRESSION: No acute intracranial abnormality. Electronically Signed   By: Allegra LaiLeah  Strickland M.D.   On: 04/11/2022 16:53   ? ?Procedures ?Procedures  ? ?Medications Ordered in ED ?Medications  ?LORazepam (ATIVAN)  injection 1 mg (1 mg Intravenous Given 04/11/22 1750)  ?LORazepam (ATIVAN) injection 0.5 mg (0.5 mg Intravenous Given 04/11/22 2131)  ? ?ED Course/ Medical Decision Making/ A&P ?  ?                        ?Medical Decision Making ?Amount and/or Complexity of Data Reviewed ?Labs: ordered. ?Radiology: ordered. ? ?Risk ?Prescription drug management. ? ?Pt is a 41 y.o. male who presents to the emergency department with his wife due to a witnessed tonic-clonic episode that occurred prior to arrival. ? ?Labs: ?CBC with platelets of 135 and lymphocytes of 0.4. ?CMP with a sodium of 133, CO2 of 19, glucose of 147, calcium of 8.8, AST of 68, ALT of 66. ?Magnesium of 2.4. ?UDS without abnormalities. ? ?Imaging: ?CT scan of the head without contrast shows no acute intracranial abnormality. ? ?I, Placido Sou, PA-C, personally reviewed and evaluated these images and lab results as part of my medical decision-making. ? ?On my initial exam patient is A&O x3.  Speaking clearly and coherently.  Strength is 5/5 in all 4 extremities.  Distal sensation intact.  No gross deficits.  He does have a tongue bite to the left lateral tongue without any active bleeding.  Appears consistent with patient's wife's story.  Patient's history and physical exam findings today appears consistent with a likely seizure.  He does note that he used to drink alcohol but has not drank any alcohol for the past month.  No other drug use and UDS is negative.  Does not appear to be in  acute withdrawals at this time.  He notes that he had a previous seizure when he was a teenager but denies any recurrent seizures since then.  States he has never taken an antiepileptic medication before.  He states that he ha

## 2022-04-14 ENCOUNTER — Encounter: Payer: Self-pay | Admitting: Neurology

## 2022-04-20 ENCOUNTER — Ambulatory Visit (INDEPENDENT_AMBULATORY_CARE_PROVIDER_SITE_OTHER): Payer: BC Managed Care – PPO | Admitting: Neurology

## 2022-04-20 ENCOUNTER — Encounter: Payer: Self-pay | Admitting: Neurology

## 2022-04-20 VITALS — BP 139/96 | HR 81 | Ht 69.0 in | Wt 150.0 lb

## 2022-04-20 DIAGNOSIS — F4320 Adjustment disorder, unspecified: Secondary | ICD-10-CM | POA: Diagnosis not present

## 2022-04-20 DIAGNOSIS — R569 Unspecified convulsions: Secondary | ICD-10-CM

## 2022-04-20 DIAGNOSIS — F603 Borderline personality disorder: Secondary | ICD-10-CM | POA: Diagnosis not present

## 2022-04-20 DIAGNOSIS — Z7189 Other specified counseling: Secondary | ICD-10-CM | POA: Diagnosis not present

## 2022-04-20 DIAGNOSIS — F1010 Alcohol abuse, uncomplicated: Secondary | ICD-10-CM | POA: Diagnosis not present

## 2022-04-20 NOTE — Patient Instructions (Signed)
Good to meet you. ? ?Schedule open MRI brain with and without contrast. Once you have scheduled it, please let me know so we can send in the Valium to take before the test and schedule follow-up with me ? ?2. Schedule 1-hour EEG ? ?3. Continue working with therapist ? ?4. Follow-up after tests, call for any changes ? ? ?Seizure Precautions: ?1. If medication has been prescribed for you to prevent seizures, take it exactly as directed.  Do not stop taking the medicine without talking to your doctor first, even if you have not had a seizure in a long time.  ? ?2. Avoid activities in which a seizure would cause danger to yourself or to others.  Don't operate dangerous machinery, swim alone, or climb in high or dangerous places, such as on ladders, roofs, or girders.  Do not drive unless your doctor says you may. ? ?3. If you have any warning that you may have a seizure, lay down in a safe place where you can't hurt yourself.   ? ?4.  No driving for 6 months from last seizure, as per [facility].   Please refer to the following link on the [place] website for more information: http://www.epilepsyfoundation.org/answerplace/Social/driving/drivingu.cfm  ? ?5.  Maintain good sleep hygiene. Start weaning off alcohol. ? ?6.  Contact your doctor if you have any problems that may be related to the medicine you are taking. ? ?7.  Call 911 and bring the patient back to the ED if: ?      ? A.  The seizure lasts longer than 5 minutes.      ? B.  The patient doesn't awaken shortly after the seizure ? C.  The patient has new problems such as difficulty seeing, speaking or moving ? D.  The patient was injured during the seizure ? E.  The patient has a temperature over 102 F (39C) ? F.  The patient vomited and now is having trouble breathing ?      ? ?

## 2022-04-20 NOTE — Progress Notes (Signed)
? ?NEUROLOGY CONSULTATION NOTE ? ?Albert Davis ?MRN: [id number] ?DOB: 1981/12/05 ? ?Referring provider: Dr. Lennice Sites (ER) ?Primary care provider: Dr. Kyung Rudd de [place] ? ?Reason for consult:  new onset seizure ? ?Dear Dr Ronnald Nian: ? ?Thank you for your kind referral of Albert Davis for consultation of the above symptoms. Although his history is well known to you, please allow me to reiterate it for the purpose of our medical record. The patient was accompanied to the clinic by his wife Vidya who also provides collateral information. Records and images were personally reviewed where available. ? ? ?HISTORY OF PRESENT ILLNESS: ?This is a 41 year old right-handed man with a history of hypertension, anxiety, presenting for evaluation of seizure. He had one seizure at age 32 or 81, there was no prior warning, he recalls they were playing then his friend reported he was starting and unresponsive then just fell with shaking. He bit his tongue and vomited. He saw his PCP but was not evaluated by Neurology. He denies any further seizures until 04/11/22. He has been under significant stress and had not slept for almost 3 days. He was tired and stressed that morning but went to the store with no issues. They got home around 3pm then he remembers his neck/head getting forced to the left. He kept trying to force his head back then woke up to EMS around him. His wife reports he was sitting on the couch with his laptop, she was in the other room and heard him fall, he had slid to the floor stiff and shaking with his head turned to the left and his right arm extended. The seizure lasted 2-3 minutes, there was blood and saliva from his mouth, he had bitten the right side of his cheek and left side of his tongue. He was confused after, saying random things, no incontinence or focal weakness. He was brought to the ER where bloodwork showed Na 133, platelets 135, AST 68, ALT 68, UDS negative. I personally reviewed head CT without  contrast which did not show any acute changes. His wife reports that 2 days prior to the seizure, he was complaining of chills and hand tremors with fine movements. They deny any staring/unresponsive episodes, no other gaps in time, olfactory/gustatory hallucinations, deja vu, rising epigastric sensation, focal numbness/tingling/weakness, myoclonic jerks. He notes tremors sometimes when trying to use a spoon and food spilling. He has occasional chest heaviness with a poking sensation radiating to his back. He has a history of heavy alcohol use and has had "drunk blackouts" in the past. He states that his last alcohol intake was a week prior to the seizure. He has cut down on alcohol, drinking 4-5 beers on the weekends and he has started going to [place]. He was given lorazepam in the ER which he has been taking every night helping a little with sleep, he has been getting 5-6 hours at night. His wife is concerned that he is getting dependent on it for sleep. He states his mind does not rest, it is always running, overthinking relationships, his job, making him feel mentally exhausted for the past year. He denies any significant headaches, sometimes he feels a poking sensation on the left side of his head when he has not eaten. He notes some tingling/"tickling" in his head for a few minutes. No dizziness, diplopia, dysarthria/dysphagia, neck/back pain, bowel/bladder dysfunction. Memory is not as sharp but it does not affect work. He works as a Government social research officer. They have started seeing a  marriage Social worker.  ? ?Epilepsy Risk Factors:  His sister had a seizure at age 75. Otherwise he had a normal birth and early development.  There is no history of febrile convulsions, CNS infections such as meningitis/encephalitis, significant traumatic brain injury, neurosurgical procedures. ? ? ?PAST MEDICAL HISTORY: ?Past Medical History:  ?Diagnosis Date  ? Hypercholesteremia   ? Hypertension   ? ? ?PAST SURGICAL HISTORY: ?No past  surgical history on file. ? ?MEDICATIONS: ?Current Outpatient Medications on File Prior to Visit  ?Medication Sig Dispense Refill  ? amLODipine (NORVASC) 5 MG tablet TAKE 1 TABLET (5 MG TOTAL) BY MOUTH DAILY. 30 tablet 0  ? LORazepam (ATIVAN) 1 MG tablet Take 1 tablet (1 mg total) by mouth daily as needed for anxiety or sleep. 10 tablet 0  ? rosuvastatin (CRESTOR) 20 MG tablet TAKE 1 TABLET BY MOUTH EVERY DAY 30 tablet 0  ? ?No current facility-administered medications on file prior to visit.  ? ? ?ALLERGIES: ?Allergies  ?Allergen Reactions  ? Lisinopril Swelling  ? ? ?FAMILY HISTORY: ?Family History  ?Problem Relation Age of Onset  ? Healthy Mother   ? Healthy Father   ? ? ?SOCIAL HISTORY: ?Social History  ? ?Socioeconomic History  ? Marital status: Married  ?  Spouse name: Not on file  ? Number of children: Not on file  ? Years of education: Not on file  ? Highest education level: Not on file  ?Occupational History  ? Not on file  ?Tobacco Use  ? Smoking status: Every Day  ?  Packs/day: 0.50  ?  Types: Cigarettes  ? Smokeless tobacco: Never  ?Vaping Use  ? Vaping Use: Never used  ?Substance and Sexual Activity  ? Alcohol use: Yes  ?  Alcohol/week: 3.0 - 4.0 standard drinks  ?  Types: 3 - 4 Shots of liquor per week  ?  Comment: 3-4 on weekends  ? Drug use: Never  ? Sexual activity: Not Currently  ?Other Topics Concern  ? Not on file  ?Social History Narrative  ? RH   ? Lives with wife  ? One story  ? Caff rare  ? ?Social Determinants of Health  ? ?Financial Resource Strain: Not on file  ?Food Insecurity: Not on file  ?Transportation Needs: Not on file  ?Physical Activity: Not on file  ?Stress: Not on file  ?Social Connections: Not on file  ?Intimate Partner Violence: Not on file  ? ? ? ?PHYSICAL EXAM: ?Vitals:  ? 04/20/22 1306  ?BP: (!) 139/96  ?Pulse: 81  ?SpO2: 99%  ? ?General: No acute distress ?Head:  Normocephalic/atraumatic ?Skin/Extremities: No rash, no edema ?Neurological Exam: ?Mental status: alert and  oriented to person, place, and time, no dysarthria or aphasia, Fund of knowledge is appropriate.  Recent and remote memory are intact, 3/3 delayed recall.  Attention and concentration are normal, 5/5 WORLD backwards.  ?Cranial nerves: ?CN I: not tested ?CN II: pupils equal, round and reactive to light, visual fields intact ?CN III, IV, VI:  full range of motion, no nystagmus, no ptosis ?CN V: facial sensation intact ?CN VII: upper and lower face symmetric ?CN VIII: hearing intact to conversation ?Bulk & Tone: normal, no fasciculations. ?Motor: 5/5 throughout with no pronator drift. ?Sensation: intact to light touch, cold, pin, vibration sense.  No extinction to double simultaneous stimulation.  Romberg test negative ?Deep Tendon Reflexes: +2 throughout ?Cerebellar: no incoordination on finger to nose testing ?Gait: narrow-based and steady, able to tandem walk adequately. ?Tremor: none in  office today ? ? ?IMPRESSION: ?This is a 41 year old right-handed man with a history of hypertension, anxiety, presenting for evaluation of seizure. He had a seizure at age 31 or 7 with staring followed by convulsion. 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 MRI brain with and without contrast and 1-hour EEG will be ordered for seizure classification. Discussed recommendation to start seizure medication, Lamotrigine may be helpful for mood stabilization as well, they would like to complete tests first. Discussed that lorazepam should not be taken on a daily basis, offered Psychiatry referral for medication management of anxiety/insomnia, they would like to start with psychotherapy first. [place] driving laws were discussed with the patient, and he knows to stop driving after a seizure, until 6 months seizure-free. We discussed avoidance of seizure triggers, including weaning off alcohol. Follow-up after tests, call for any changes.   ? ? ?Thank you for allowing me to participate in the care of this patient. Please do not hesitate to call for any questions or concerns. ? ? ?Ellouise Newer, M.D. ? ?CC: Dr. [place], Dr. Ronnald Nian ? ?

## 2022-04-22 ENCOUNTER — Telehealth: Payer: Self-pay | Admitting: Neurology

## 2022-04-22 NOTE — Telephone Encounter (Signed)
[facility] called and stated they need a prior authorization for his MRI scheduled on May 26. ?

## 2022-04-23 ENCOUNTER — Telehealth: Payer: Self-pay | Admitting: Neurology

## 2022-04-23 NOTE — Telephone Encounter (Signed)
PA number is [id number] Berkley Harvey is good from 04/23/22 until 05/22/22

## 2022-04-23 NOTE — Telephone Encounter (Signed)
Joni Reining from [facility] called and left a message stating the MRI that has been scheduled for the patient needs PA. She saw it was done through the portal, but stated it cannot be one on the portal, it has to be done by calling [phone]. Their NPI is [id number].

## 2022-04-23 NOTE — Telephone Encounter (Addendum)
Per [employer] website no PA is needed for his MRI of the Brain with an with out contrast, [facility] was called an informed,

## 2022-04-26 ENCOUNTER — Other Ambulatory Visit (HOSPITAL_BASED_OUTPATIENT_CLINIC_OR_DEPARTMENT_OTHER): Payer: Self-pay | Admitting: Family Medicine

## 2022-04-27 ENCOUNTER — Other Ambulatory Visit (HOSPITAL_BASED_OUTPATIENT_CLINIC_OR_DEPARTMENT_OTHER): Payer: Self-pay | Admitting: Family Medicine

## 2022-04-27 DIAGNOSIS — Z608 Other problems related to social environment: Secondary | ICD-10-CM | POA: Diagnosis not present

## 2022-04-27 DIAGNOSIS — F603 Borderline personality disorder: Secondary | ICD-10-CM | POA: Diagnosis not present

## 2022-04-27 DIAGNOSIS — F1010 Alcohol abuse, uncomplicated: Secondary | ICD-10-CM | POA: Diagnosis not present

## 2022-04-27 DIAGNOSIS — Z7189 Other specified counseling: Secondary | ICD-10-CM | POA: Diagnosis not present

## 2022-04-29 ENCOUNTER — Ambulatory Visit (INDEPENDENT_AMBULATORY_CARE_PROVIDER_SITE_OTHER): Payer: BC Managed Care – PPO | Admitting: Neurology

## 2022-04-29 DIAGNOSIS — R569 Unspecified convulsions: Secondary | ICD-10-CM | POA: Diagnosis not present

## 2022-05-01 DIAGNOSIS — R569 Unspecified convulsions: Secondary | ICD-10-CM | POA: Diagnosis not present

## 2022-05-06 DIAGNOSIS — F1010 Alcohol abuse, uncomplicated: Secondary | ICD-10-CM | POA: Diagnosis not present

## 2022-05-06 DIAGNOSIS — F418 Other specified anxiety disorders: Secondary | ICD-10-CM | POA: Diagnosis not present

## 2022-05-06 DIAGNOSIS — Z7189 Other specified counseling: Secondary | ICD-10-CM | POA: Diagnosis not present

## 2022-05-06 DIAGNOSIS — F603 Borderline personality disorder: Secondary | ICD-10-CM | POA: Diagnosis not present

## 2022-05-06 NOTE — Procedures (Signed)
ELECTROENCEPHALOGRAM REPORT  Date of Study: 04/29/2022  Patient's Name: Albert Davis MRN: [id number] Date of Birth: 1981/04/04  Referring Provider: Dr. Patrcia Dolly  Clinical History: This is a 41 year old man seizure-free off medication for 25 years until 04/2022 with forced head deviation to the left. EEG for classification.  Medications: Norvasc Crestor  Technical Summary: A multichannel digital 1-hour EEG recording measured by the international 10-20 system with electrodes applied with paste and impedances below 5000 ohms performed in our laboratory with EKG monitoring in an awake and asleep patient.  Hyperventilation and photic stimulation were performed.  The digital EEG was referentially recorded, reformatted, and digitally filtered in a variety of bipolar and referential montages for optimal display.    Description: The patient is awake and asleep during the recording.  During maximal wakefulness, there is a symmetric, medium voltage 10 Hz posterior dominant rhythm that attenuates with eye opening.  The record is symmetric.  During drowsiness and stage I sleep, there is an increase in theta slowing of the background with vertex waves seen. Hyperventilation and photic stimulation did not elicit any abnormalities.  There were no epileptiform discharges or electrographic seizures seen.    EKG lead was unremarkable.  Impression: This 1-hour awake and asleep EEG is normal.    Clinical Correlation: A normal EEG does not exclude a clinical diagnosis of epilepsy.  If further clinical questions remain, prolonged EEG may be helpful.  Clinical correlation is advised.   Patrcia Dolly, M.D.

## 2022-05-12 ENCOUNTER — Ambulatory Visit (INDEPENDENT_AMBULATORY_CARE_PROVIDER_SITE_OTHER): Payer: BC Managed Care – PPO | Admitting: Neurology

## 2022-05-12 ENCOUNTER — Encounter: Payer: Self-pay | Admitting: Neurology

## 2022-05-12 VITALS — BP 142/98 | HR 98 | Ht 69.0 in | Wt 152.0 lb

## 2022-05-12 DIAGNOSIS — R569 Unspecified convulsions: Secondary | ICD-10-CM

## 2022-05-12 NOTE — Progress Notes (Signed)
NEUROLOGY FOLLOW UP OFFICE NOTE  Albert MolaJacob Vanyo [id number] 09/04/1981  HISTORY OF PRESENT ILLNESS: I had the pleasure of seeing Albert Davis in follow-up in the neurology clinic on 05/12/2022.  The patient was last seen 2 weeks ago for new onset seizure. He is again accompanied by his wife Vidya who helps supplement the history today.  Records and images were personally reviewed where available. He had a brain MRI with and without contrast on 5/26 which did not show any acute changes, hippocampi symmetric. There is note of mild generalized parenchymal volume loss, images unavailable for review. His 1-hour EEG was normal. He and his wife deny any further seizures or seizure-like symptoms since 04/11/22. No staring/unresponsive episodes, gaps in time, olfactory/gustatory hallucinations, focal numbness/tingling/weakness, myoclonic jerks. He reports feeling "heavy-headed," no headaches. He does not sleep, he gets "0 to 2 hours" and may take 10-15 minute naps then jerks awake. His mind is always restless. He drinks 4 beers 3-4 days a week (has been drinking heavily for 20 years), and he gets restless if he does not drink, leading to fights at home. They have started marriage counseling.     History on Initial Assessment 04/20/2022: This is a 41 year old right-handed man with a history of hypertension, anxiety, presenting for evaluation of seizure. He had one seizure at age 41 or [age], there was no prior warning, he recalls they were playing then his friend reported he was starting and unresponsive then just fell with shaking. He bit his tongue and vomited. He saw his PCP but was not evaluated by Neurology. He denies any further seizures until 04/11/22. He has been under significant stress and had not slept for almost 3 days. He was tired and stressed that morning but went to the store with no issues. They got home around 3pm then he remembers his neck/head getting forced to the left. He kept trying to force his head  back then woke up to EMS around him. His wife reports he was sitting on the couch with his laptop, she was in the other room and heard him fall, he had slid to the floor stiff and shaking with his head turned to the left and his right arm extended. The seizure lasted 2-3 minutes, there was blood and saliva from his mouth, he had bitten the right side of his cheek and left side of his tongue. He was confused after, saying random things, no incontinence or focal weakness. He was brought to the ER where bloodwork showed Na 133, platelets 135, AST 68, ALT 68, UDS negative. I personally reviewed head CT without contrast which did not show any acute changes. His wife reports that 2 days prior to the seizure, he was complaining of chills and hand tremors with fine movements. They deny any staring/unresponsive episodes, no other gaps in time, olfactory/gustatory hallucinations, deja vu, rising epigastric sensation, focal numbness/tingling/weakness, myoclonic jerks. He notes tremors sometimes when trying to use a spoon and food spilling. He has occasional chest heaviness with a poking sensation radiating to his back. He has a history of heavy alcohol use and has had "drunk blackouts" in the past. He states that his last alcohol intake was a week prior to the seizure. He has cut down on alcohol, drinking 4-5 beers on the weekends and he has started going to AA. He was given lorazepam in the ER which he has been taking every night helping a little with sleep, he has been getting 5-6 hours at night. His  wife is concerned that he is getting dependent on it for sleep. He states his mind does not rest, it is always running, overthinking relationships, his job, making him feel mentally exhausted for the past year. He denies any significant headaches, sometimes he feels a poking sensation on the left side of his head when he has not eaten. He notes some tingling/"tickling" in his head for a few minutes. No dizziness, diplopia,  dysarthria/dysphagia, neck/back pain, bowel/bladder dysfunction. Memory is not as sharp but it does not affect work. He works as a Emergency planning/management officer. They have started seeing a marriage counselor.   Epilepsy Risk Factors:  His sister had a seizure at age 52. Otherwise he had a normal birth and early development.  There is no history of febrile convulsions, CNS infections such as meningitis/encephalitis, significant traumatic brain injury, neurosurgical procedures.  Diagnostic Data: brain MRI with and without contrast on 5/26 did not show any acute changes, hippocampi symmetric. There is note of mild generalized parenchymal volume loss, images unavailable for review.   His 1-hour EEG 04/2022 was normal.  PAST MEDICAL HISTORY: Past Medical History:  Diagnosis Date   Hypercholesteremia    Hypertension     MEDICATIONS: Current Outpatient Medications on File Prior to Visit  Medication Sig Dispense Refill   amLODipine (NORVASC) 5 MG tablet TAKE 1 TABLET (5 MG TOTAL) BY MOUTH DAILY. 30 tablet 0   rosuvastatin (CRESTOR) 20 MG tablet TAKE 1 TABLET BY MOUTH EVERY DAY 30 tablet 0   LORazepam (ATIVAN) 1 MG tablet Take 1 tablet (1 mg total) by mouth daily as needed for anxiety or sleep. (Patient not taking: Reported on 05/12/2022) 10 tablet 0   No current facility-administered medications on file prior to visit.    ALLERGIES: Allergies  Allergen Reactions   Lisinopril Swelling    FAMILY HISTORY: Family History  Problem Relation Age of Onset   Healthy Mother    Healthy Father     SOCIAL HISTORY: Social History   Socioeconomic History   Marital status: Married    Spouse name: Not on file   Number of children: Not on file   Years of education: Not on file   Highest education level: Not on file  Occupational History   Not on file  Tobacco Use   Smoking status: Every Day    Types: Cigarettes   Smokeless tobacco: Never   Tobacco comments:    3 cigarettes a day  Vaping Use   Vaping  Use: Never used  Substance and Sexual Activity   Alcohol use: Yes    Alcohol/week: 3.0 - 4.0 standard drinks    Types: 3 - 4 Shots of liquor per week    Comment: 3-4 on weekends   Drug use: Never   Sexual activity: Not Currently  Other Topics Concern   Not on file  Social History Narrative   RH    Lives with wife   One story   Caff rare   Social Determinants of Health   Financial Resource Strain: Not on file  Food Insecurity: Not on file  Transportation Needs: Not on file  Physical Activity: Not on file  Stress: Not on file  Social Connections: Not on file  Intimate Partner Violence: Not on file     PHYSICAL EXAM: Vitals:   05/12/22 1100 05/12/22 1143  BP: (!) 146/102 (!) 142/98  Pulse: (!) 108 98  SpO2: 99%    General: No acute distress Head:  Normocephalic/atraumatic Skin/Extremities: No rash, no edema  Neurological Exam: alert and awake. No aphasia or dysarthria. Fund of knowledge is appropriate. Attention and concentration are normal.   Cranial nerves: Pupils equal, round. Extraocular movements intact. No facial asymmetry.  Motor: moves all extremities symmetrically. Gait narrow-based and steady, no ataxia.   IMPRESSION: This is a 41 yo RH man with a history of hypertension, anxiety, alcohol use, with focal seizure that secondarily generalized last 04/14/22. He had forced head deviation to the left prior to the GTC, suggestive of right hemisphere onset. We reviewed brain MRI images today, no acute changes seen. There is note of mild diffuse volume loss, likely due to chronic alcohol use. His 1-hour EEG is normal. We discussed option to start seizure medication or monitor symptoms with plan to start if there is seizure recurrence. He reports poor sleep due to anxiety, as well as restlessness when he is unable to drink alcohol. Discussed the importance of managing these symptoms as these can trigger seizures, He will speak to his PCP. Resources for alcohol abuse programs were  also provided. With history of seizure 25 years ago and recent seizure with focal onset, would consider starting Lamotrigine if he decides to start seizure medication. He is aware of [place] driving laws to stop driving until 6 months seizure-free. Follow-up in 4 months, call for any changes.    Thank you for allowing me to participate in his care.  Please do not hesitate to call for any questions or concerns.    Patrcia Dolly, M.D.   CC: Dr. De Peru

## 2022-05-12 NOTE — Patient Instructions (Signed)
Good to see you.  Discuss starting medication for anxiety/sleep with PCP, eventual alcohol cessation  2. Let me know if you would like to proceed with starting a seizure medication  3. Follow-up in 4 months, call for any changes   Seizure Precautions: 1. If medication has been prescribed for you to prevent seizures, take it exactly as directed.  Do not stop taking the medicine without talking to your doctor first, even if you have not had a seizure in a long time.   2. Avoid activities in which a seizure would cause danger to yourself or to others.  Don't operate dangerous machinery, swim alone, or climb in high or dangerous places, such as on ladders, roofs, or girders.  Do not drive unless your doctor says you may.  3. If you have any warning that you may have a seizure, lay down in a safe place where you can't hurt yourself.    4.  No driving for 6 months from last seizure, as per [facility].   Please refer to the following link on the [facility] website for more information: http://[email or web address]/[email or web address]/drivingu.cfm   5.  Maintain good sleep hygiene. Start weaning off alcohol.  6.  Contact your doctor if you have any problems that may be related to the medicine you are taking.  7.  Call 911 and bring the patient back to the ED if:        A.  The seizure lasts longer than 5 minutes.       B.  The patient doesn't awaken shortly after the seizure  C.  The patient has new problems such as difficulty seeing, speaking or moving  D.  The patient was injured during the seizure  E.  The patient has a temperature over 102 F (39C)  F.  The patient vomited and now is having trouble breathing

## 2022-05-13 DIAGNOSIS — Z7189 Other specified counseling: Secondary | ICD-10-CM | POA: Diagnosis not present

## 2022-05-13 DIAGNOSIS — F1010 Alcohol abuse, uncomplicated: Secondary | ICD-10-CM | POA: Diagnosis not present

## 2022-05-13 DIAGNOSIS — F418 Other specified anxiety disorders: Secondary | ICD-10-CM | POA: Diagnosis not present

## 2022-05-13 DIAGNOSIS — F603 Borderline personality disorder: Secondary | ICD-10-CM | POA: Diagnosis not present

## 2022-06-30 ENCOUNTER — Other Ambulatory Visit (HOSPITAL_BASED_OUTPATIENT_CLINIC_OR_DEPARTMENT_OTHER): Payer: Self-pay | Admitting: Family Medicine

## 2022-07-06 ENCOUNTER — Ambulatory Visit (INDEPENDENT_AMBULATORY_CARE_PROVIDER_SITE_OTHER): Payer: BC Managed Care – PPO | Admitting: Family Medicine

## 2022-07-06 ENCOUNTER — Encounter (HOSPITAL_BASED_OUTPATIENT_CLINIC_OR_DEPARTMENT_OTHER): Payer: Self-pay | Admitting: Family Medicine

## 2022-07-06 DIAGNOSIS — G4730 Sleep apnea, unspecified: Secondary | ICD-10-CM | POA: Diagnosis not present

## 2022-07-06 DIAGNOSIS — I10 Essential (primary) hypertension: Secondary | ICD-10-CM | POA: Diagnosis not present

## 2022-07-06 DIAGNOSIS — F32A Depression, unspecified: Secondary | ICD-10-CM

## 2022-07-06 DIAGNOSIS — G4700 Insomnia, unspecified: Secondary | ICD-10-CM

## 2022-07-06 DIAGNOSIS — F419 Anxiety disorder, unspecified: Secondary | ICD-10-CM

## 2022-07-06 MED ORDER — ESCITALOPRAM OXALATE 5 MG PO TABS: 5.0000 mg | ORAL_TABLET | Freq: Every day | ORAL | 1 refills | Status: DC

## 2022-07-06 MED ORDER — AMLODIPINE BESYLATE 5 MG PO TABS: 5.0000 mg | ORAL_TABLET | Freq: Every day | ORAL | 1 refills | Status: DC

## 2022-07-06 MED ORDER — ROSUVASTATIN CALCIUM 20 MG PO TABS: 20.0000 mg | ORAL_TABLET | Freq: Every day | ORAL | 1 refills | Status: DC

## 2022-07-06 NOTE — Progress Notes (Unsigned)
Procedures performed today:    None.  Independent interpretation of notes and tests performed by another provider:   None.  Brief History, Exam, Impression, and Recommendations:    BP 138/89   Pulse (!) 101   Ht 5\' 9"  (1.753 m)   Wt 147 lb 12.8 oz (67 kg)   SpO2 98%   BMI 21.83 kg/m   Patient presents today for a follow-up visit.  He is accompanied by his wife.  Was last seen about 6 months ago with recommendation for 1 month follow-up.  He has questions today regarding anxiety/depression, sleep issues, hypertension.  Primary hypertension Blood pressure is borderline in office today.  He continues with amlodipine 5 mg once daily.  Home monitoring has been infrequent.  Denies any current issues related to amlodipine.  Denies any issues with chest pain, headaches, dizziness. For now, we will continue with current regimen.  Recommend intermittent monitoring of blood pressure at home, DASH diet.  Also feel that complete abstinence from alcohol would be beneficial in regards to managing blood pressure  Insomnia Patient reports ongoing sleep issues, this has been chronic for him.  Will have trouble with falling asleep and staying asleep.  Patient and family member also indicate that he will take naps during the day.  Reports that there will be times of increased irritability and will subsequently go to lay down and will sleep for a few hours during the day.  There is some reported snoring in conjunction with daytime somnolence.  Possibly witnessed apneic episodes while sleeping as well.  He is adamant today that he needs a medication that will help him to sleep at night.  At last office visit, we had discussed role of CBT in regards to insomnia and patient had declined at that time. Discussed optimal recommendations related to insomnia.  As part of evaluation, would need to further assess for possible underlying sleep apnea contributing to poor sleep at night.  Will need to avoid daytime  naps as these can also impair nighttime rest.  Previously provided sleep hygiene recommendations. Patient also with some underlying anxiety and depression, discussed that optimal control of these would likely aid in sleep improvement, see separate discussion.  Discussed risks related to medications like benzodiazepines which may help with sleep initiation but have notable side effects associated with them as well as impairing quality of sleep. Again discussed role for CBT in regards to underlying and depression as well as in treatment of insomnia.  Patient is amenable to referral today, referral placed  Sleep apnea In discussion of sleep concerns, patient and family do report issues with daytime somnolence, snoring, witnessed apneic episodes.  Epworth sleepiness scale completed today with suggestion of possible underlying sleep apnea.  Discussed these concerns and potential adverse effects related to untreated sleep apnea.  Recommend referral to sleep medicine specialist for further evaluation, patient in agreement, referral placed today  Anxiety and depression Patient with increased symptoms of anxiety and depression, these are also reported by wife present with him at today's visit.  PHQ-9 with a score of 17, GAD-7 score of 15.  Symptoms have been affecting patient in various aspects of his life, certainly in the home setting.  Discussed optimal treatment of underlying anxiety depression, discussed role of pharmacotherapy as well as counseling.  After long discussion regarding these, patient amenable to initiating both. We will start with low-dose of escitalopram 5 mg once daily, discussed potential side effects, plan for close follow-up in about 2  weeks to assess response, adverse effects Referral placed to psychology as well  Return in about 2 weeks (around 07/20/2022) for Med check.   ___________________________________________ Punam Broussard de Peru, MD, ABFM, CAQSM Primary Care and Sports  Medicine [facility]

## 2022-07-06 NOTE — Patient Instructions (Signed)
  Medication Instructions:  Your physician recommends that you continue on your current medications as directed. Please refer to the Current Medication list given to you today. --If you need a refill on any your medications before your next appointment, please call your pharmacy first. If no refills are authorized on file call the office.-- Lab Work: Your physician has recommended that you have lab work today: No If you have labs (blood work) drawn today and your tests are completely normal, you will receive your results via MyChart message OR a phone call from our staff.  Please ensure you check your voicemail in the event that you authorized detailed messages to be left on a delegated number. If you have any lab test that is abnormal or we need to change your treatment, we will call you to review the results.  Referrals/Procedures/Imaging: No  Follow-Up: Your next appointment:   Your physician recommends that you schedule a follow-up appointment in: 2 weeks virtual with Dr. de Peru.  You will receive a text message or e-mail with a link to a survey about your care and experience with [place] today! We would greatly appreciate your feedback!   Thanks for letting us be apart of your health journey!!  Primary Care and Sports Medicine   Dr. Ceasar Mons Peru   We encourage you to activate your patient portal called "MyChart".  Sign up information is provided on this After Visit Summary.  MyChart is used to connect with patients for Virtual Visits (Telemedicine).  Patients are able to view lab/test results, encounter notes, upcoming appointments, etc.  Non-urgent messages can be sent to your provider as well. To learn more about what you can do with MyChart, please visit --  [email or web address].

## 2022-07-08 NOTE — Assessment & Plan Note (Signed)
Patient with increased symptoms of anxiety and depression, these are also reported by wife present with him at today's visit.  PHQ-9 with a score of 17, GAD-7 score of 15.  Symptoms have been affecting patient in various aspects of his life, certainly in the home setting.  Discussed optimal treatment of underlying anxiety depression, discussed role of pharmacotherapy as well as counseling.  After long discussion regarding these, patient amenable to initiating both. We will start with low-dose of escitalopram 5 mg once daily, discussed potential side effects, plan for close follow-up in about 2 weeks to assess response, adverse effects Referral placed to psychology as well

## 2022-07-08 NOTE — Assessment & Plan Note (Signed)
Patient reports ongoing sleep issues, this has been chronic for him.  Will have trouble with falling asleep and staying asleep.  Patient and family member also indicate that he will take naps during the day.  Reports that there will be times of increased irritability and will subsequently go to lay down and will sleep for a few hours during the day.  There is some reported snoring in conjunction with daytime somnolence.  Possibly witnessed apneic episodes while sleeping as well.  He is adamant today that he needs a medication that will help him to sleep at night.  At last office visit, we had discussed role of CBT in regards to insomnia and patient had declined at that time. Discussed optimal recommendations related to insomnia.  As part of evaluation, would need to further assess for possible underlying sleep apnea contributing to poor sleep at night.  Will need to avoid daytime naps as these can also impair nighttime rest.  Previously provided sleep hygiene recommendations. Patient also with some underlying anxiety and depression, discussed that optimal control of these would likely aid in sleep improvement, see separate discussion.  Discussed risks related to medications like benzodiazepines which may help with sleep initiation but have notable side effects associated with them as well as impairing quality of sleep. Again discussed role for CBT in regards to underlying Albert Davis and depression as well as in treatment of insomnia.  Patient is amenable to referral today, referral placed

## 2022-07-08 NOTE — Assessment & Plan Note (Signed)
Blood pressure is borderline in office today.  He continues with amlodipine 5 mg once daily.  Home monitoring has been infrequent.  Denies any current issues related to amlodipine.  Denies any issues with chest pain, headaches, dizziness. For now, we will continue with current regimen.  Recommend intermittent monitoring of blood pressure at home, DASH diet.  Also feel that complete abstinence from alcohol would be beneficial in regards to managing blood pressure

## 2022-07-08 NOTE — Assessment & Plan Note (Signed)
In discussion of sleep concerns, patient and family do report issues with daytime somnolence, snoring, witnessed apneic episodes.  Epworth sleepiness scale completed today with suggestion of possible underlying sleep apnea.  Discussed these concerns and potential adverse effects related to untreated sleep apnea.  Recommend referral to sleep medicine specialist for further evaluation, patient in agreement, referral placed today

## 2022-07-09 IMAGING — US US ABDOMEN LIMITED
1 series · 14 of 25 positions shown · non-contrast
Comparison: None.

CLINICAL DATA: Transaminitis.

EXAM:
ULTRASOUND ABDOMEN LIMITED RIGHT UPPER QUADRANT

[Series 1: us abdomen limited · 0.17mm/px · 14 of 84 slices shown]
[im 1/84]
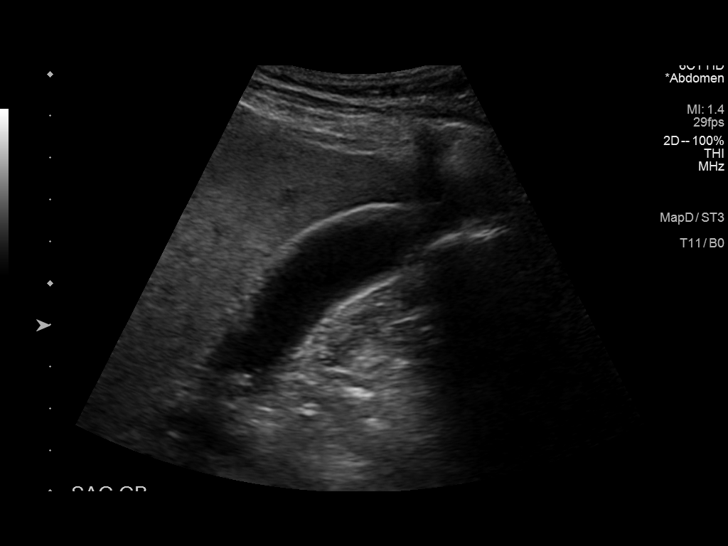
[im 7/84]
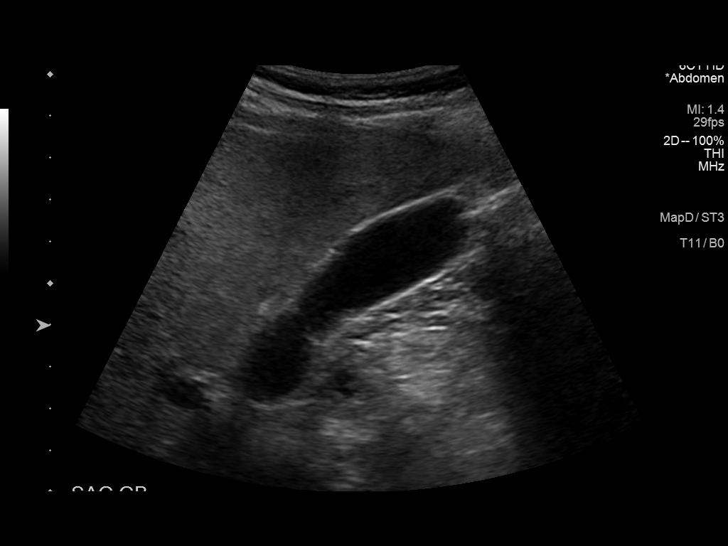
[im 14/84]
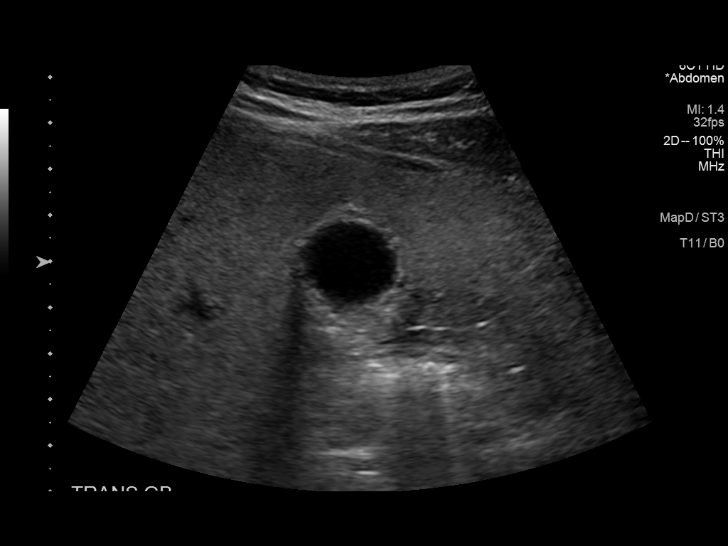
[im 21/84]
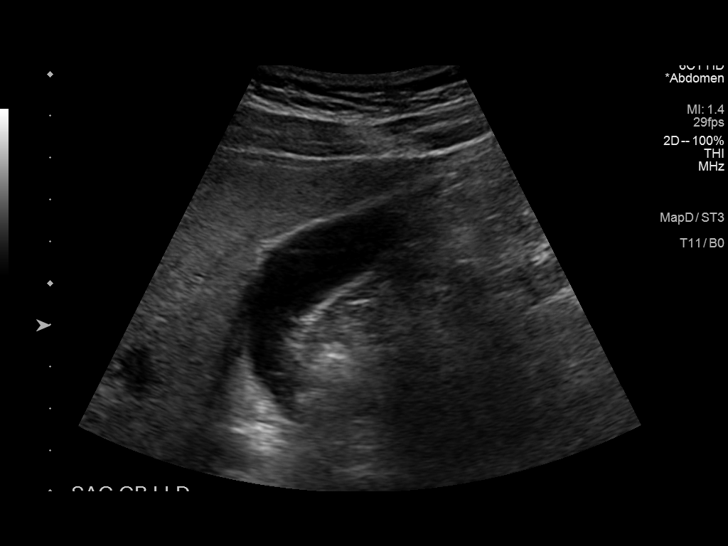
[im 28/84]
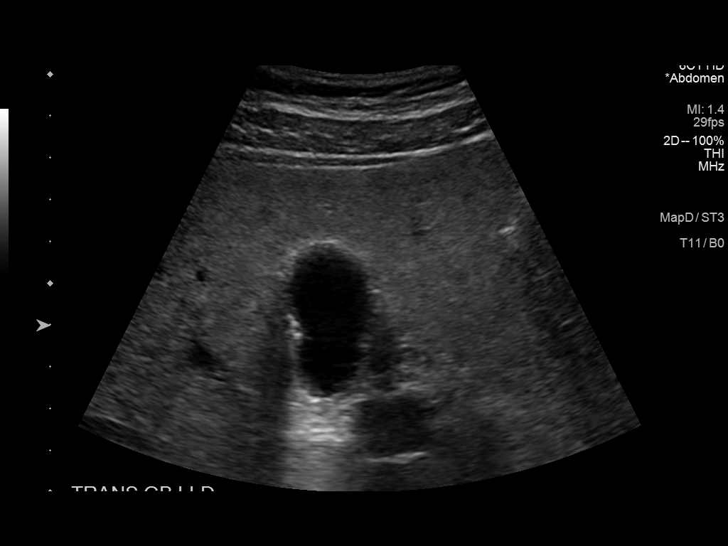
[im 32/84]
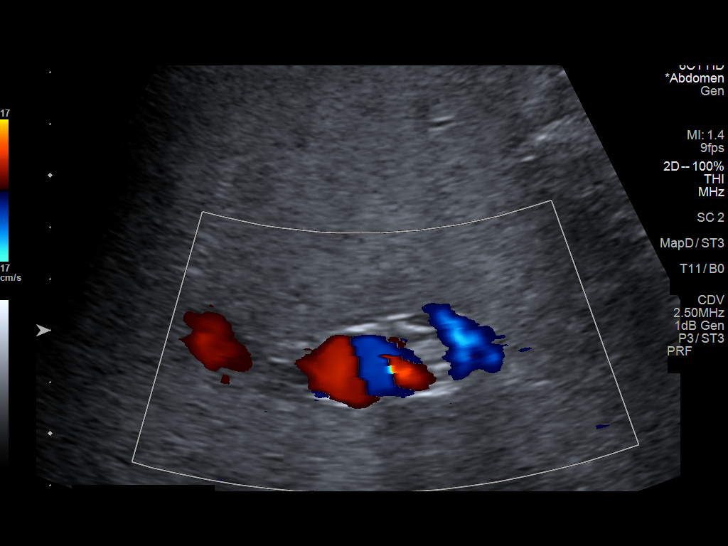
[im 39/84]
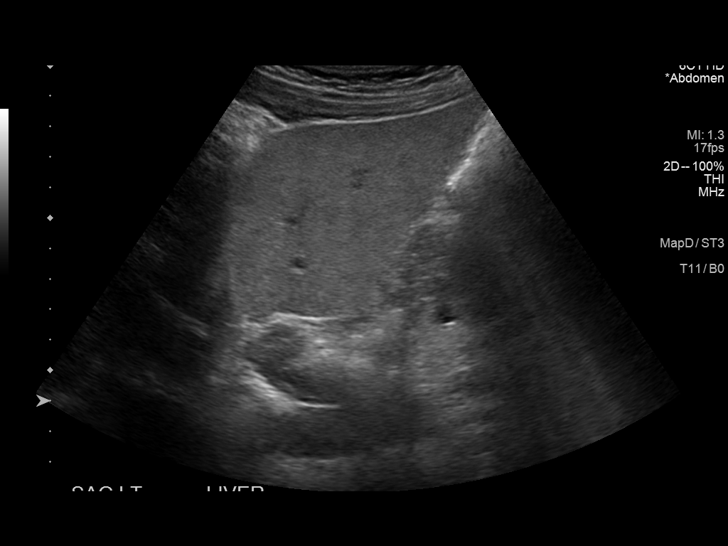
[im 45/84]
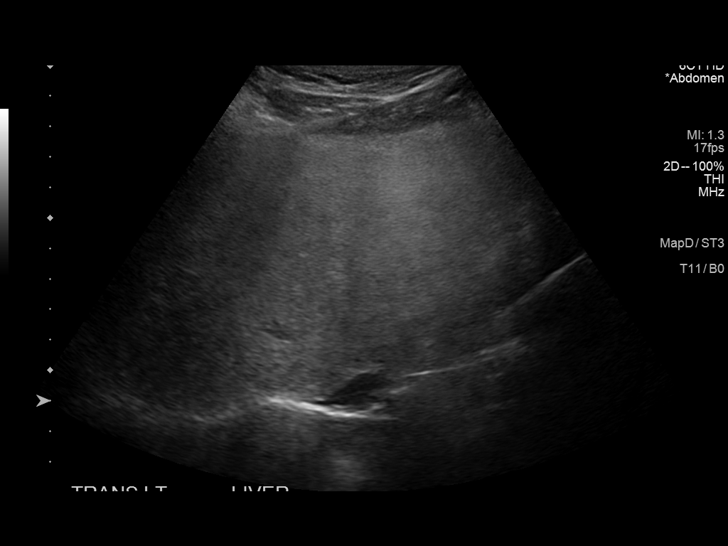
[im 52/84]
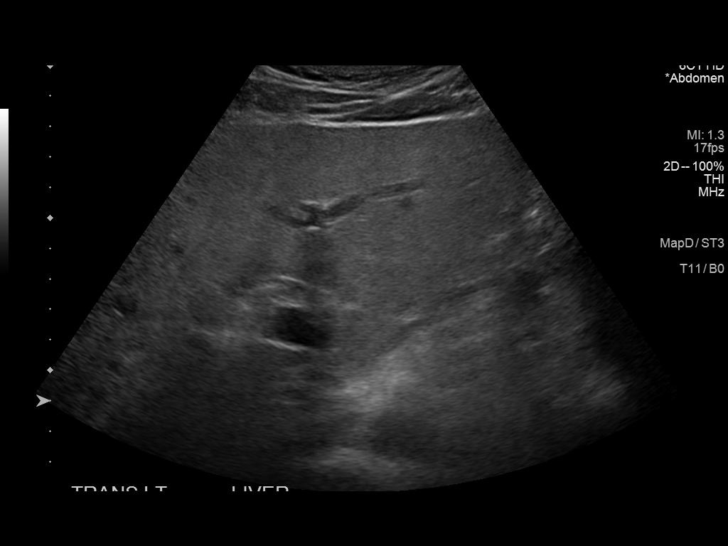
[im 56/84]
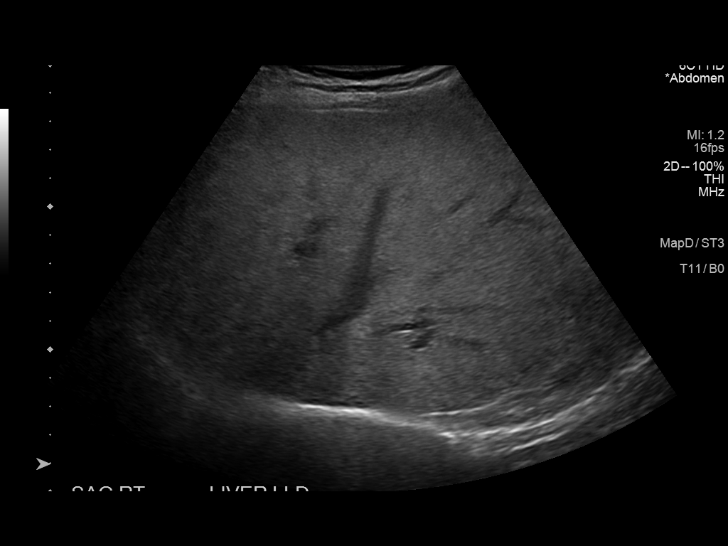
[im 63/84]
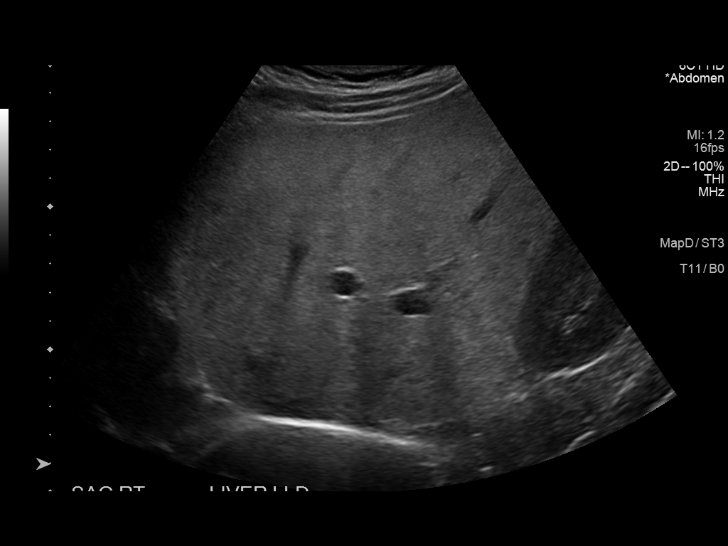
[im 70/84]
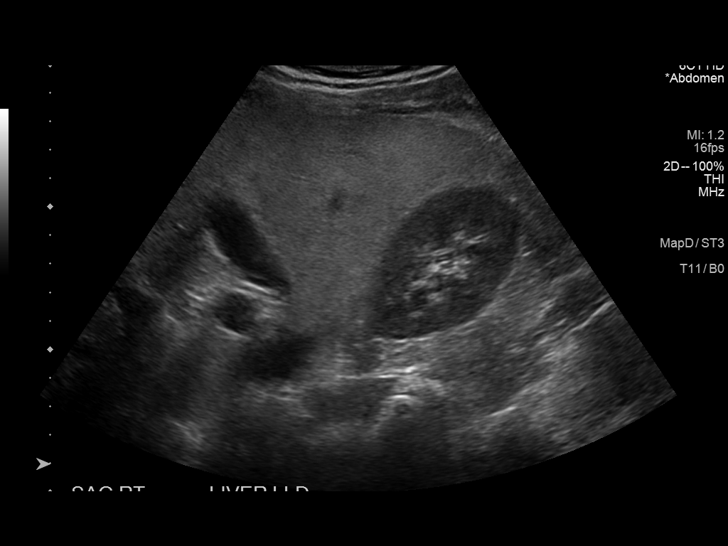
[im 77/84]
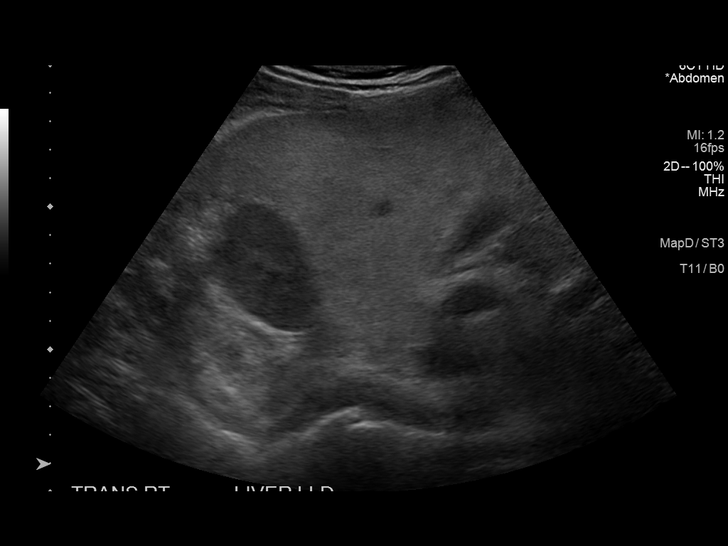
[im 84/84]
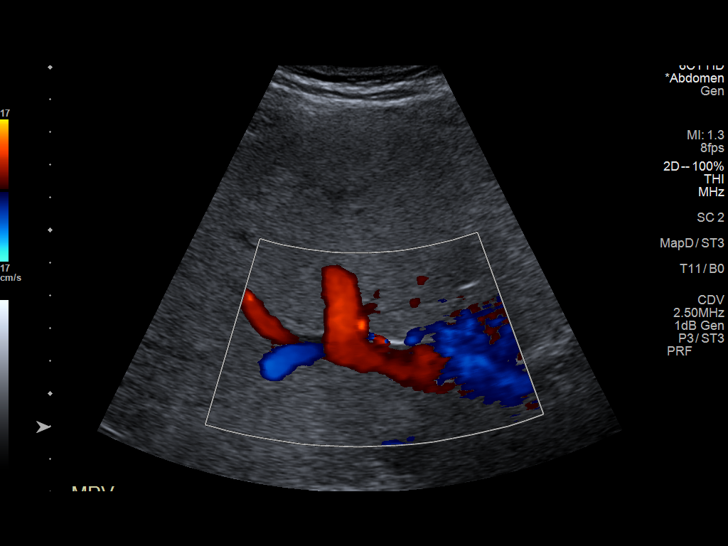

[14 of 25 positions shown; findings below may reference images not displayed]

FINDINGS: Gallbladder:

No gallstones or wall thickening visualized. No sonographic Murphy
sign noted by sonographer.

Common bile duct:

Diameter: 1.4 mm

Liver:

No focal lesion identified. Increase in parenchymal echogenicity.
Diffusely heterogeneous. Portal vein is patent on color Doppler
imaging with normal direction of blood flow towards the liver.

Other: None.
IMPRESSION: 1. Echogenic heterogeneous liver likely related to diffuse
hepatocellular disease such as fatty infiltration.

## 2022-07-21 ENCOUNTER — Encounter (HOSPITAL_BASED_OUTPATIENT_CLINIC_OR_DEPARTMENT_OTHER): Payer: Self-pay | Admitting: Family Medicine

## 2022-07-21 ENCOUNTER — Ambulatory Visit (INDEPENDENT_AMBULATORY_CARE_PROVIDER_SITE_OTHER): Payer: BC Managed Care – PPO | Admitting: Family Medicine

## 2022-07-21 DIAGNOSIS — G4700 Insomnia, unspecified: Secondary | ICD-10-CM | POA: Diagnosis not present

## 2022-07-21 DIAGNOSIS — F32A Depression, unspecified: Secondary | ICD-10-CM

## 2022-07-21 DIAGNOSIS — F419 Anxiety disorder, unspecified: Secondary | ICD-10-CM | POA: Diagnosis not present

## 2022-07-21 MED ORDER — TRAZODONE HCL 50 MG PO TABS: 50.0000 mg | ORAL_TABLET | Freq: Every day | ORAL | 1 refills | Status: AC

## 2022-07-21 MED ORDER — ESCITALOPRAM OXALATE 10 MG PO TABS: 10.0000 mg | ORAL_TABLET | Freq: Every day | ORAL | 1 refills | Status: DC

## 2022-07-21 NOTE — Progress Notes (Unsigned)
Virtual Visit via Telephone   I connected with  Danel Requena  on 07/22/22 by telephone/telehealth and verified that I am speaking with the correct person using two identifiers.   I discussed the limitations, risks, security and privacy concerns of performing an evaluation and management service by telephone, including the higher likelihood of inaccurate diagnosis and treatment, and the availability of in person appointments.  We also discussed the likely need of an additional face to face encounter for complete and high quality delivery of care.  I also discussed with the patient that there may be a patient responsible charge related to this service. The patient expressed understanding and wishes to proceed.  Provider location is in medical facility. Patient location is at their home, different from provider location. People involved in care of the patient during this telehealth encounter were myself, my nurse/medical assistant, and my front office/scheduling team member.  Review of Systems: No fevers, chills, night sweats, weight loss, chest pain, or shortness of breath.   Objective Findings:    General: Speaking full sentences, no audible heavy breathing.  Sounds alert and appropriately interactive.    Independent interpretation of tests performed by another provider:   None.  Brief History, Exam, Impression, and Recommendations:    Anxiety and depression At last visit, patient was started on escitalopram 5 mg daily.  He was having notable anxiety and depression symptoms at that time.  Today, patient seems unclear on if he has been taking the escitalopram regularly, seems to have some confusion regarding lorazepam and Lexapro.  He does not have medications in front of him currently, however does think that he has been taking the escitalopram regularly.  Denies any side effects at present, however has not noticed significant change in symptoms as of yet.  He was referred to establish with  psychologist, Dr. Bosie Clos.  It appears that his office did attempt to contact patient few times, however they have been unable to reach patient.  He does maintain interest in counseling/therapy related to underlying Estelle June and depression as well as sleep issues. Discussed options with patient today, we will proceed with dose titration of escitalopram as he has been tolerating the medication well, will increase to 10 mg daily We will reach out to our referral coordinator regarding psychology referral so that patient may schedule this establishing visit  Insomnia Patient continues to have issues with sleep, reports that he is primarily having trouble with falling asleep, also will have trouble staying asleep.  If he is not able to fall asleep within a reasonable time, he oftentimes will get up and do other things including watching TV.  He previously was referred to establish with psychologist regarding underlying anxiety and depression as well as to hopefully lead to improvement in sleep with cognitive behavioral therapy.  As above, patient unfortunately has not scheduled establishing visit as of yet.  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 Patient is adamant about needing something to assist with sleep.  Discussed importance of establishing with a psychologist, also discussed appropriate sleep hygiene measures, particularly related to screen time near bedtime and certainly to not watch TV or utilize screens if he is unable to fall asleep We will allow for trial of trazodone for the time being to try to help with underlying depression as well as sleep concerns  I discussed the above assessment and treatment plan  with the patient. The patient was provided an opportunity to ask questions and all were answered. The patient agreed with the plan and demonstrated an understanding of the instructions.  The  patient was advised to call back or seek an in-person evaluation if the symptoms worsen or if the condition fails to improve as anticipated.  I provided 20 minutes of face to face and non-face-to-face time during this encounter date, time was needed to gather information, review chart, records, communicate/coordinate with staff remotely, as well as complete documentation.   ___________________________________________ Brandyn Lowrey de [place], MD, ABFM, CAQSM Primary Care and Sports Medicine [facility]

## 2022-07-22 NOTE — Assessment & Plan Note (Signed)
At last visit, patient was started on escitalopram 5 mg daily.  He was having notable anxiety and depression symptoms at that time.  Today, patient seems unclear on if he has been taking the escitalopram regularly, seems to have some confusion regarding lorazepam and Lexapro.  He does not have medications in front of him currently, however does think that he has been taking the escitalopram regularly.  Denies any side effects at present, however has not noticed significant change in symptoms as of yet.  He was referred to establish with psychologist, Dr. Bosie Clos.  It appears that his office did attempt to contact patient few times, however they have been unable to reach patient.  He does maintain interest in counseling/therapy related to underlying Estelle June and depression as well as sleep issues. Discussed options with patient today, we will proceed with dose titration of escitalopram as he has been tolerating the medication well, will increase to 10 mg daily We will reach out to our referral coordinator regarding psychology referral so that patient may schedule this establishing visit

## 2022-07-22 NOTE — Assessment & Plan Note (Signed)
Patient continues to have issues with sleep, reports that he is primarily having trouble with falling asleep, also will have trouble staying asleep.  If he is not able to fall asleep within a reasonable time, he oftentimes will get up and do other things including watching TV.  He previously was referred to establish with psychologist regarding underlying anxiety and depression as well as to hopefully lead to improvement in sleep with cognitive behavioral therapy.  As above, patient unfortunately has not scheduled establishing visit as of yet.  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 Patient is adamant about needing something to assist with sleep.  Discussed importance of establishing with a psychologist, also discussed appropriate sleep hygiene measures, particularly related to screen time near bedtime and certainly to not watch TV or utilize screens if he is unable to fall asleep We will allow for trial of trazodone for the time being to try to help with underlying depression as well as sleep concerns

## 2022-08-14 IMAGING — CT CT HEAD W/O CM
4 series · 15 of 47 positions shown, 17 images · non-contrast
Comparison: None Available.

CLINICAL DATA: Syncope



[Series 3: head wo · axial · 0.34mm/px · z∈[-217,-81]mm · 7 of 38 slices shown, 9 images]
[im 5/38  brain]
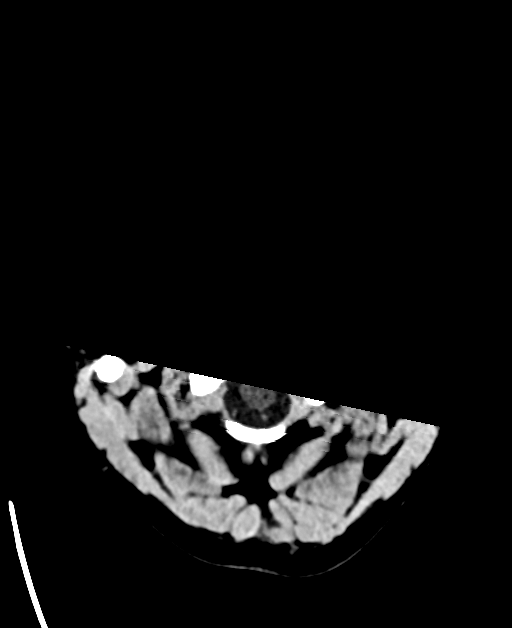
[im 5/38  bone]
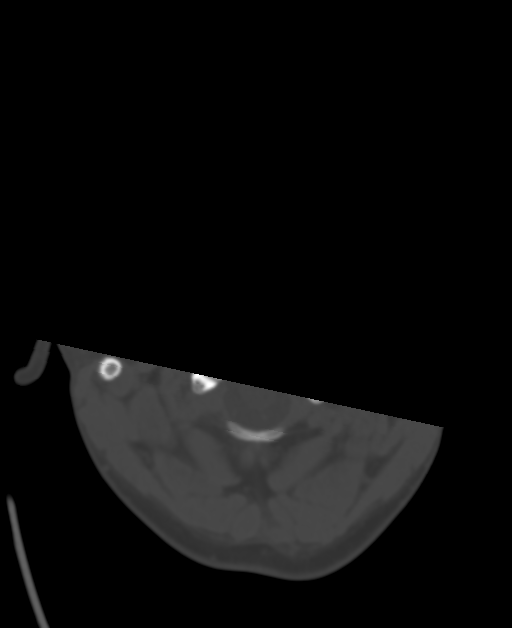
[im 10/38  brain]
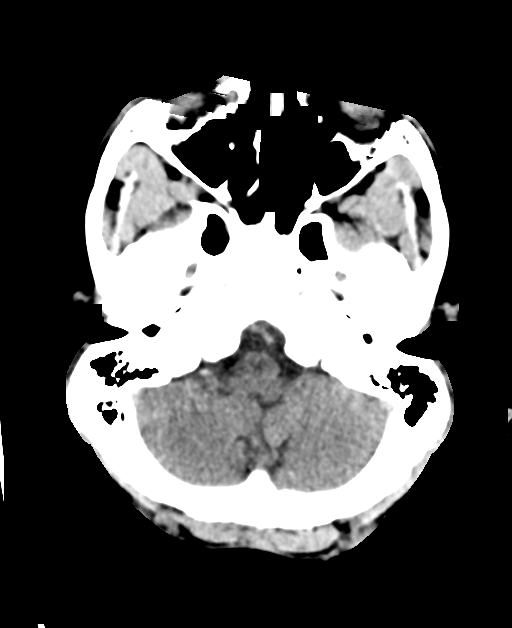
[im 14/38  brain]
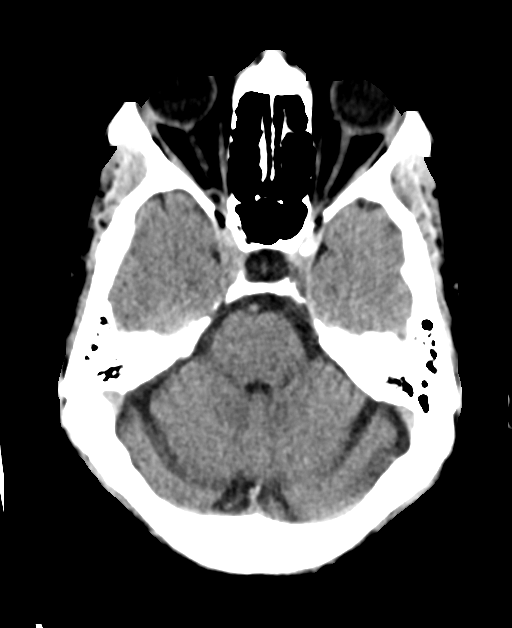
[im 19/38  brain]
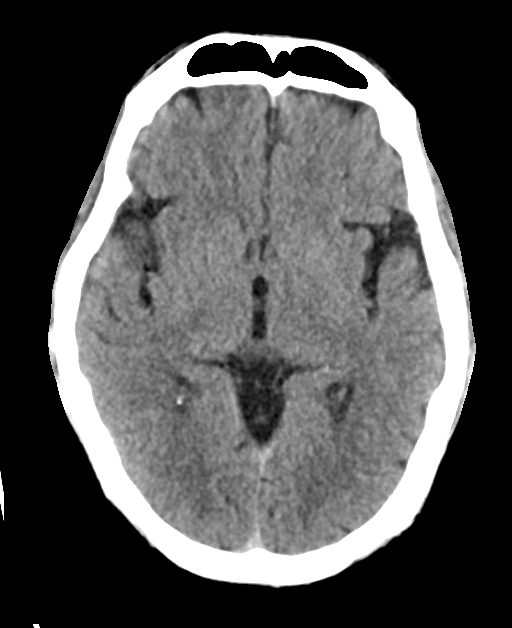
[im 24/38  brain]
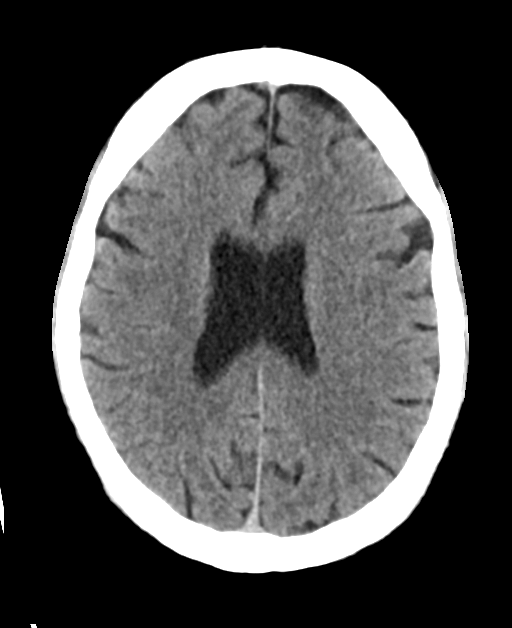
[im 24/38  bone]
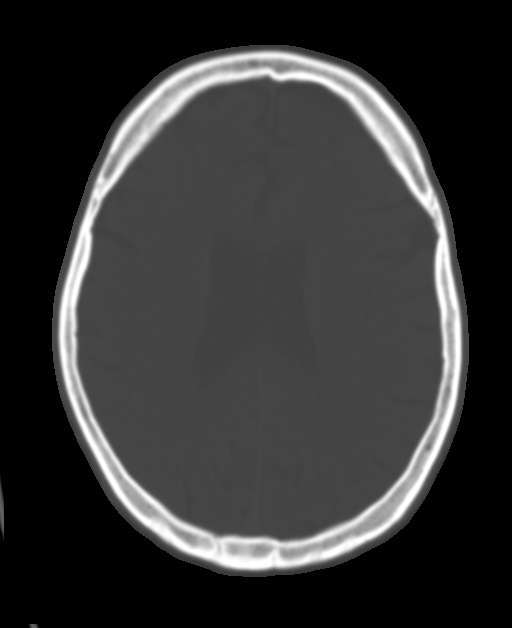
[im 28/38  brain]
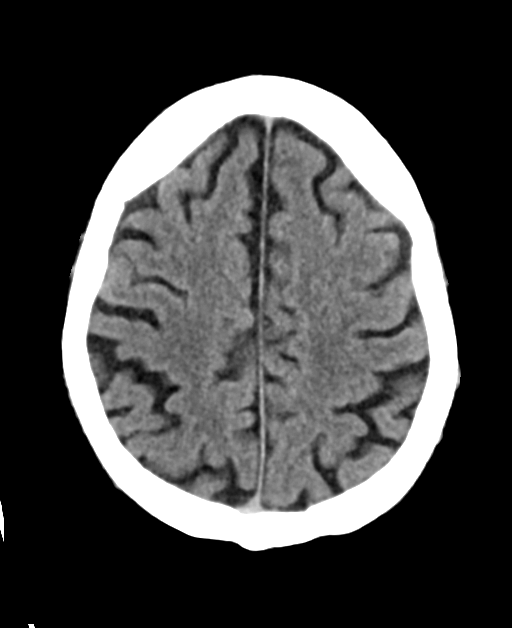
[im 33/38  brain]
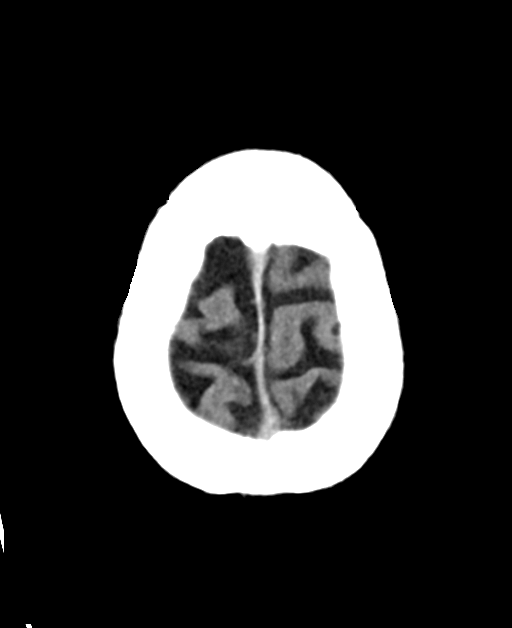

[Series 4: head bone · axial · 0.41mm/px · z∈[-172,-154]mm · 2 of 86 slices shown]
[im 9/86  bone]
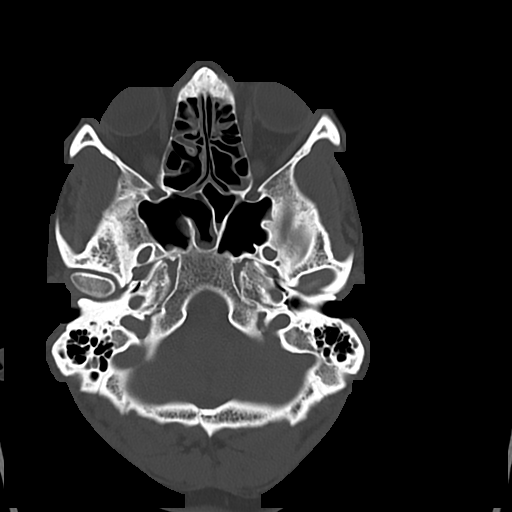
[im 18/86  bone]
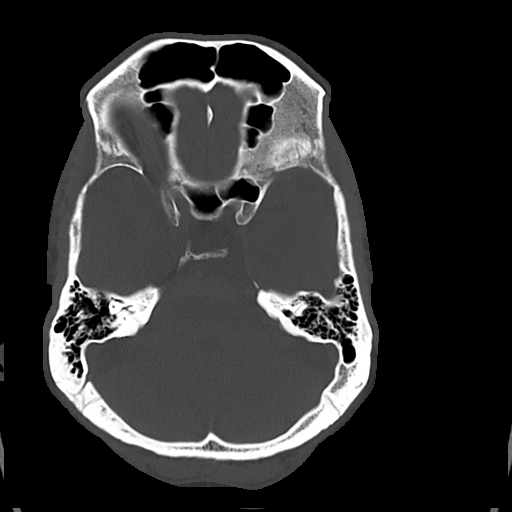

[Series 5: cor soft · coronal · 0.35mm/px · 3 of 70 slices shown]
[im 24/70  brain]
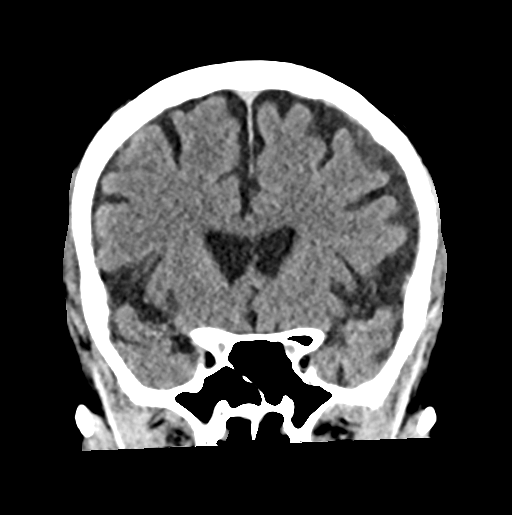
[im 31/70  brain]
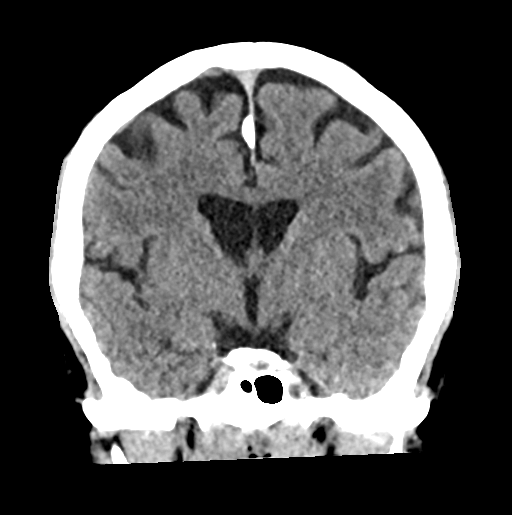
[im 39/70  brain]
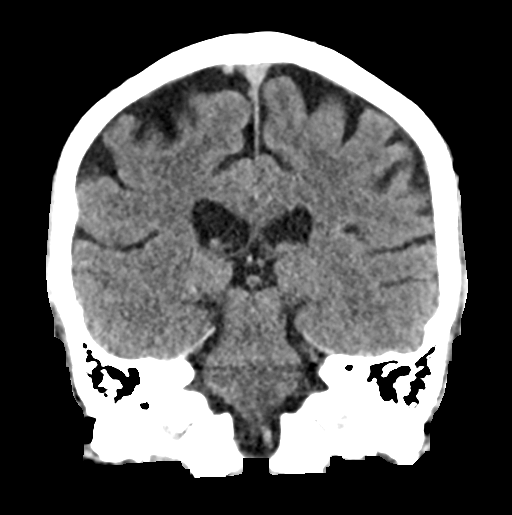

[Series 6: sag soft · sagittal · 0.38mm/px · 3 of 60 slices shown]
[im 20/60  brain]
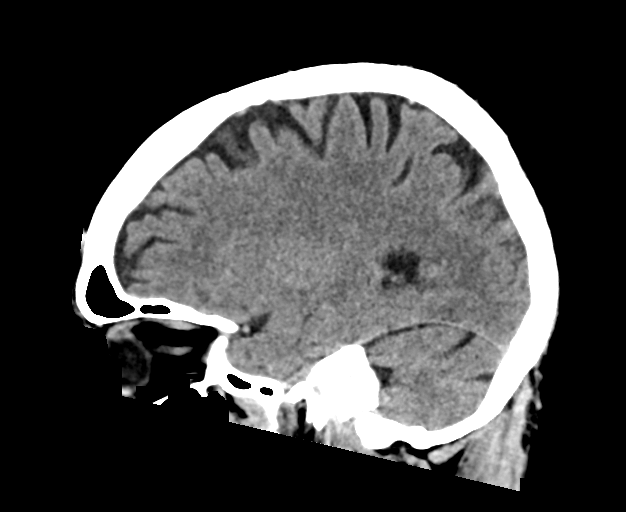
[im 30/60  brain]
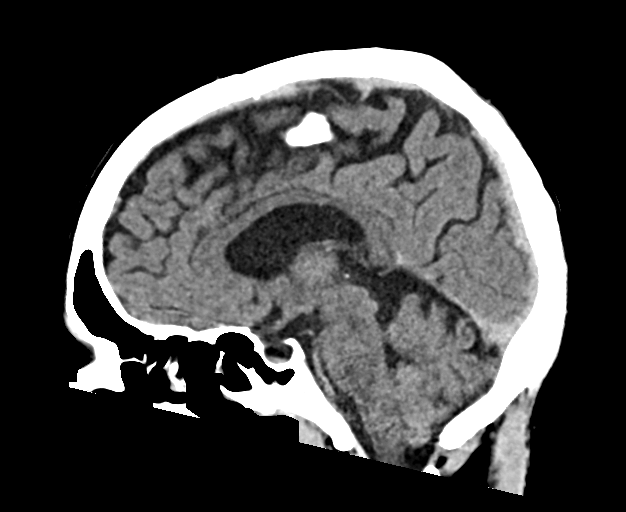
[im 40/60  brain]
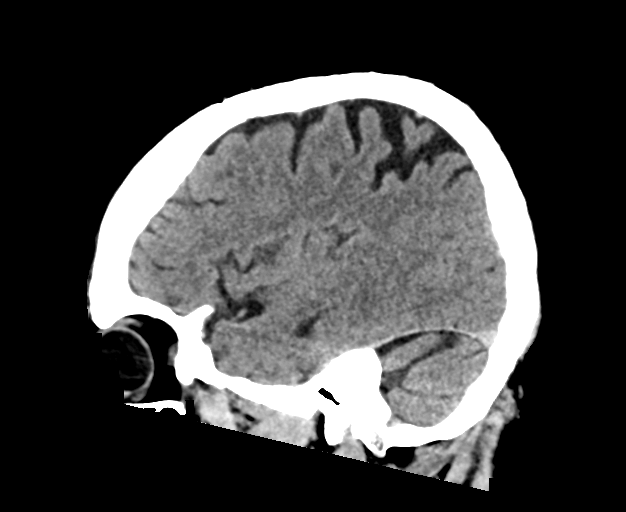

[15 of 47 positions shown; findings below may reference images not displayed]

FINDINGS: Brain: No evidence of acute infarction, hemorrhage, hydrocephalus,
extra-axial collection or mass lesion/mass effect.

Vascular: No hyperdense vessel or unexpected calcification.

Skull: Normal. Negative for fracture or focal lesion.

Sinuses/Orbits: Mucosal thickening of the ethmoid sinuses. No acute
finding.

Other: None.
IMPRESSION: No acute intracranial abnormality.

## 2022-08-27 ENCOUNTER — Emergency Department (HOSPITAL_COMMUNITY): Payer: BC Managed Care – PPO

## 2022-08-27 ENCOUNTER — Encounter (HOSPITAL_COMMUNITY): Payer: Self-pay

## 2022-08-27 ENCOUNTER — Emergency Department (HOSPITAL_COMMUNITY)
Admission: EM | Admit: 2022-08-27 | Discharge: 2022-08-27 | Disposition: A | Discharge status: Home / Self Care | Payer: BC Managed Care – PPO | Attending: Emergency Medicine | Admitting: Emergency Medicine

## 2022-08-27 ENCOUNTER — Other Ambulatory Visit: Payer: Self-pay

## 2022-08-27 DIAGNOSIS — S0512XA Contusion of eyeball and orbital tissues, left eye, initial encounter: Secondary | ICD-10-CM | POA: Diagnosis not present

## 2022-08-27 DIAGNOSIS — S0181XA Laceration without foreign body of other part of head, initial encounter: Secondary | ICD-10-CM | POA: Diagnosis not present

## 2022-08-27 DIAGNOSIS — R410 Disorientation, unspecified: Secondary | ICD-10-CM | POA: Diagnosis not present

## 2022-08-27 DIAGNOSIS — R000 Tachycardia, unspecified: Secondary | ICD-10-CM | POA: Insufficient documentation

## 2022-08-27 DIAGNOSIS — Z043 Encounter for examination and observation following other accident: Secondary | ICD-10-CM | POA: Diagnosis not present

## 2022-08-27 DIAGNOSIS — Z23 Encounter for immunization: Secondary | ICD-10-CM | POA: Insufficient documentation

## 2022-08-27 DIAGNOSIS — Y92481 Parking lot as the place of occurrence of the external cause: Secondary | ICD-10-CM | POA: Insufficient documentation

## 2022-08-27 DIAGNOSIS — I4891 Unspecified atrial fibrillation: Secondary | ICD-10-CM | POA: Diagnosis not present

## 2022-08-27 DIAGNOSIS — Y9301 Activity, walking, marching and hiking: Secondary | ICD-10-CM | POA: Insufficient documentation

## 2022-08-27 DIAGNOSIS — R569 Unspecified convulsions: Secondary | ICD-10-CM | POA: Insufficient documentation

## 2022-08-27 DIAGNOSIS — W01198A Fall on same level from slipping, tripping and stumbling with subsequent striking against other object, initial encounter: Secondary | ICD-10-CM | POA: Diagnosis not present

## 2022-08-27 DIAGNOSIS — S01112A Laceration without foreign body of left eyelid and periocular area, initial encounter: Secondary | ICD-10-CM | POA: Diagnosis not present

## 2022-08-27 DIAGNOSIS — I499 Cardiac arrhythmia, unspecified: Secondary | ICD-10-CM | POA: Diagnosis not present

## 2022-08-27 DIAGNOSIS — S0990XA Unspecified injury of head, initial encounter: Secondary | ICD-10-CM | POA: Diagnosis not present

## 2022-08-27 LAB — I-STAT CHEM 8, ED
BUN: 4 mg/dL — ABNORMAL LOW (ref 6–20)
Calcium, Ion: 1.08 mmol/L — ABNORMAL LOW (ref 1.15–1.40)
Chloride: 101 mmol/L (ref 98–111)
Creatinine, Ser: 0.9 mg/dL (ref 0.61–1.24)
Glucose, Bld: 110 mg/dL — ABNORMAL HIGH (ref 70–99)
HCT: 47 % (ref 39.0–52.0)
Hemoglobin: 16 g/dL (ref 13.0–17.0)
Potassium: 3.5 mmol/L (ref 3.5–5.1)
Sodium: 135 mmol/L (ref 135–145)
TCO2: 15 mmol/L — ABNORMAL LOW (ref 22–32)

## 2022-08-27 LAB — CBC WITH DIFFERENTIAL/PLATELET
Abs Immature Granulocytes: 0.04 10*3/uL (ref 0.00–0.07)
Basophils Absolute: 0 10*3/uL (ref 0.0–0.1)
Basophils Relative: 0 %
Eosinophils Absolute: 0.1 10*3/uL (ref 0.0–0.5)
Eosinophils Relative: 1 %
HCT: 45.3 % (ref 39.0–52.0)
Hemoglobin: 16 g/dL (ref 13.0–17.0)
Immature Granulocytes: 0 %
Lymphocytes Relative: 32 %
Lymphs Abs: 3 10*3/uL (ref 0.7–4.0)
MCH: 33.5 pg (ref 26.0–34.0)
MCHC: 35.3 g/dL (ref 30.0–36.0)
MCV: 95 fL (ref 80.0–100.0)
Monocytes Absolute: 0.8 10*3/uL (ref 0.1–1.0)
Monocytes Relative: 9 %
Neutro Abs: 5.3 10*3/uL (ref 1.7–7.7)
Neutrophils Relative %: 58 %
Platelets: 156 10*3/uL (ref 150–400)
RBC: 4.77 MIL/uL (ref 4.22–5.81)
RDW: 11.7 % (ref 11.5–15.5)
WBC: 9.3 10*3/uL (ref 4.0–10.5)
nRBC: 0 % (ref 0.0–0.2)

## 2022-08-27 LAB — COMPREHENSIVE METABOLIC PANEL
ALT: 72 U/L — ABNORMAL HIGH (ref 0–44)
AST: 139 U/L — ABNORMAL HIGH (ref 15–41)
Albumin: 4.4 g/dL (ref 3.5–5.0)
Alkaline Phosphatase: 80 U/L (ref 38–126)
Anion gap: 23 — ABNORMAL HIGH (ref 5–15)
BUN: 6 mg/dL (ref 6–20)
CO2: 13 mmol/L — ABNORMAL LOW (ref 22–32)
Calcium: 8.9 mg/dL (ref 8.9–10.3)
Chloride: 101 mmol/L (ref 98–111)
Creatinine, Ser: 1.13 mg/dL (ref 0.61–1.24)
GFR, Estimated: >60 mL/min (ref >60)
Glucose, Bld: 119 mg/dL — ABNORMAL HIGH (ref 70–99)
Potassium: 3.6 mmol/L (ref 3.5–5.1)
Sodium: 137 mmol/L (ref 135–145)
Total Bilirubin: 1 mg/dL (ref 0.3–1.2)
Total Protein: 7.6 g/dL (ref 6.5–8.1)

## 2022-08-27 LAB — LIPASE, BLOOD: Lipase: 66 U/L — ABNORMAL HIGH (ref 11–51)

## 2022-08-27 LAB — CBG MONITORING, ED: Glucose-Capillary: 123 mg/dL — ABNORMAL HIGH (ref 70–99)

## 2022-08-27 MED ORDER — LACTATED RINGERS IV BOLUS
1000.0000 mL | Freq: Once | INTRAVENOUS | Status: AC
  Administered 2022-08-27: 1000 mL via INTRAVENOUS

## 2022-08-27 MED ORDER — CHLORDIAZEPOXIDE HCL 25 MG PO CAPS: ORAL_CAPSULE | ORAL | 0 refills | Status: AC

## 2022-08-27 MED ORDER — LORAZEPAM 2 MG/ML IJ SOLN
2.0000 mg | Freq: Once | INTRAMUSCULAR | Status: AC
  Administered 2022-08-27: 2 mg via INTRAVENOUS
  Filled 2022-08-27: qty 1

## 2022-08-27 MED ORDER — TETANUS-DIPHTH-ACELL PERTUSSIS 5-2.5-18.5 LF-MCG/0.5 IM SUSY
0.5000 mL | PREFILLED_SYRINGE | Freq: Once | INTRAMUSCULAR | Status: AC
  Administered 2022-08-27: 0.5 mL via INTRAMUSCULAR
  Filled 2022-08-27: qty 0.5

## 2022-08-27 MED ORDER — LIDOCAINE HCL (PF) 1 % IJ SOLN
5.0000 mL | Freq: Once | INTRAMUSCULAR | Status: AC
  Administered 2022-08-27: 5 mL
  Filled 2022-08-27: qty 5

## 2022-08-27 MED ORDER — CHLORDIAZEPOXIDE HCL 25 MG PO CAPS
50.0000 mg | ORAL_CAPSULE | Freq: Once | ORAL | Status: AC
  Administered 2022-08-27: 50 mg via ORAL
  Filled 2022-08-27: qty 2

## 2022-08-27 MED ORDER — OXYCODONE-ACETAMINOPHEN 5-325 MG PO TABS
1.0000 | ORAL_TABLET | Freq: Once | ORAL | Status: AC
  Administered 2022-08-27: 1 via ORAL
  Filled 2022-08-27: qty 1

## 2022-08-27 NOTE — ED Triage Notes (Signed)
[employer] parking lot, fell had a seizure. By stander said abt 1 min long. Lacerations all over head, face and hand. Pt also chewed up his tongue. Pt is not on any meds for seizure. Pt is also in Aifb RVR, which EMS gave 30 mg cardizem that brought the pt's HR from over 200 down to 170s. A&O X4.

## 2022-08-27 NOTE — ED Provider Notes (Signed)
[facility] EMERGENCY DEPARTMENT Provider Note   CSN: [id number] Arrival date & time: 08/27/22  1857     History Chief Complaint  Patient presents with   Seizures   afib rvr    HPI Albert Davis is a 41 y.o. male presenting for seizure episode, tachycardia.  Per EMS, he was walking to the store when he collapsed.  Bystander saw diffuse shaking lasting approximately 2 minutes.  When EMS got there he was beginning to recovered.  He was on concrete and has multiple facial lacerations.  He denies any prodrome.  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  He also had a seizure recently after quitting drinking. He denies fevers or chills nausea vomiting, syncope or shortness of breath prior to the event. With EMS, patient was tachycardic to the 200s.  Patient's recorded medical, surgical, social, medication list and allergies were reviewed in the Snapshot window as part of the initial history.   Review of Systems   Review of Systems  Constitutional:  Negative for chills and fever.  HENT:  Negative for ear pain and sore throat.   Eyes:  Negative for pain and visual disturbance.  Respiratory:  Negative for cough and shortness of breath.   Cardiovascular:  Negative for chest pain and palpitations.  Gastrointestinal:  Negative for abdominal pain and vomiting.  Genitourinary:  Negative for dysuria and hematuria.  Musculoskeletal:  Negative for arthralgias and back pain.  Skin:  Negative for color change and rash.  Neurological:  Positive for seizures. Negative for syncope.  All other systems reviewed and are negative.   Physical Exam Updated Vital Signs BP (!) 139/92   Pulse (!) 104   Temp 97.8 F (36.6 C) (Oral)   Resp 19   Ht 5\' 9"  (1.753 m)   Wt 67 kg   SpO2 99%   BMI 21.81 kg/m  Physical Exam Vitals and nursing note reviewed.  Constitutional:      General: He is not in acute distress.     Appearance: He is well-developed.  HENT:     Head: Normocephalic and atraumatic.  Eyes:     Conjunctiva/sclera: Conjunctivae normal.  Cardiovascular:     Rate and Rhythm: Normal rate and regular rhythm.     Heart sounds: No murmur heard. Pulmonary:     Effort: Pulmonary effort is normal. No respiratory distress.     Breath sounds: Normal breath sounds.  Abdominal:     Palpations: Abdomen is soft.     Tenderness: There is no abdominal tenderness.  Musculoskeletal:        General: Deformity (Multiple injuries appreciated.  1 cm laceration to left eyebrow, 1 cm laceration to chin, deformity over left cheek, no tenderness to palpation of the neck.  Full range of motion of the neck.  Full ocular range of motion.) present. No swelling.     Cervical back: Neck supple.  Skin:    General: Skin is warm and dry.     Capillary Refill: Capillary refill takes less than 2 seconds.  Neurological:     General: No focal deficit present.     Mental Status: He is alert and oriented to person, place, and time.     Cranial Nerves: No cranial nerve deficit.     Motor: No weakness.  Psychiatric:        Mood and Affect: Mood normal.      ED Course/ Medical  Decision Making/ A&P Clinical Course as of 08/27/22 2306  Thu Aug 27, 2022  2042 Cts ok [CC]    Clinical Course User Index [CC] Tretha Sciara, MD    Procedures Procedures   Medications Ordered in ED Medications  LORazepam (ATIVAN) injection 2 mg (2 mg Intravenous Given 08/27/22 1946)  lactated ringers bolus 1,000 mL (0 mLs Intravenous Stopped 08/27/22 2050)  lidocaine (PF) (XYLOCAINE) 1 % injection 5 mL (5 mLs Other Given by Other 08/27/22 2118)  Tdap (BOOSTRIX) injection 0.5 mL (0.5 mLs Intramuscular Given 08/27/22 2118)  chlordiazePOXIDE (LIBRIUM) capsule 50 mg (50 mg Oral Given 08/27/22 2118)  lactated ringers bolus 1,000 mL (0 mLs Intravenous Stopped 08/27/22 2252)  oxyCODONE-acetaminophen (PERCOCET/ROXICET) 5-325 MG per tablet 1 tablet  (1 tablet Oral Given 08/27/22 2244)    Medical Decision Making:    Albert Davis is a 42 y.o. male who presented to the ED today with fall and witnessed seizure episode detailed above.     Patient's presentation is complicated by their history of recent alcohol cessation..  Patient placed on continuous vitals and telemetry monitoring while in ED which was reviewed periodically.   Complete initial physical exam performed, notably the patient  was HDS in NAD.      Reviewed and confirmed nursing documentation for past medical history, family history, social history.    Initial Assessment:   This is a patient presenting with a chief complaint of recurrent seizure.  This is his second seizure in the past 3 weeks after quitting alcohol.  He was discharged prior with Ativan to use until he followed up with a primary care provider.  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  So he Quit both alcohol and Ativan.  Today as he was walking to the store he felt jittery and then collapsed.  He fell face first into the asphalt.  Facial lacerations concerning for underlying fracture.  CTs performed with no acute fracture identified.  Patient observed for 4 hours in the emergency department with return to his normal mental status.  He does have a large tongue laceration with unfortunate tissue avulsion.  He recalls spitting out a large portion.  Does not appear to be amenable to primary repair in the emergency department.  Facial lacerations repaired in emergency department with glue and stitches. Discussed findings with patient.  He continues to be tachycardic after multiple medication administrations but is requesting discharge.  He likely is continuing to have alcohol withdrawals and informed him of his risk of repeat seizures.  He states he would like to be discharged on medications to prevent this.  He  recently saw neurology who recommended initiation of Keppra.  He states he is going to call them back to make a final decision as he still uncertain about adding on another medication.  However he did express interest in a benzodiazepine taper.  Informed him of risk of overdose and recommended his wife keep the medication.  He expressed consent with this plan and will plan to have his wife administer the medications to him. He is currently ambulatory tolerating p.o. intake requesting discharge in no acute distress.  Informed him of risk of serious adverse event in the setting of alcohol withdrawals, however he continued to request discharge at this time.  Patient discharged with no further acute events. Clinical Impression:  1. Seizure ([place])  Data Unavailable   Final Clinical Impression(s) / ED Diagnoses Final diagnoses:  Seizure [facility])    Rx / DC Orders ED Discharge Orders          Ordered    chlordiazePOXIDE (LIBRIUM) 25 MG capsule        08/27/22 2303              Glyn Ade, MD 08/27/22 2306

## 2022-08-27 NOTE — Discharge Instructions (Addendum)
You have a very complex syndrome related to your alcohol use, We have had prolonged discussions today regarding alcohol withdrawals and you have expressed interest in starting a Librium taper and completely stopping alcohol use.   A Librium taper is a slowly decreasing medication which will help control your withdrawal symptoms.  Please note this medication has a variable effects on each person and you will have to change the amount of medication you are taking.  Only take the medicine when you feel the onset of withdrawals.  Withdrawals include symptoms of heart racing or tachycardia, agitation or anxiety, headaches, nausea or vomiting.  You may take the medicine as prescribed and the taper when you are having the above symptoms.   You are likely to need more medications at the beginning that you will at the end of the taper.  Start with 2 capsules (50 mg) as frequent as every 6 hours as needed for up to 2 days.  Then decrease to 2 capsules every 8 hours for up to 2 days, then decrease to 1 capsule every 8 hours for up to 2 days, then decrease to 1 capsule every 12 hours for up to 2 days then decrease to 1 capsule daily for 2 days.  The medication is only to be taken as needed.  If you are not having the above withdrawal symptoms, you may be more aggressive with your taper.   The taper does not work for every patient, some patients need more medication than can be safely prescribed in the outpatient setting.  If the Librium is not controlling your alcohol withdrawal syndrome you will need to return to the hospital for more aggressive interventions and possible admission.  If you begin to drink while taking the Librium, you should completely stop taking the Librium as there is a risk for overdose if used in the context of alcohol use.   I recommend you give the medication to a trusted family member who may distribute the medicine to you on an "as needed" basis and can stop the distribution if you  relapse.  Please follow up with a local Psychiatric provider.  Thank you for the opportunity to take part of your care, please return if you are having any problems with the medication or your ongoing symptoms.   Tretha Sciara, MD

## 2022-08-31 ENCOUNTER — Encounter (HOSPITAL_BASED_OUTPATIENT_CLINIC_OR_DEPARTMENT_OTHER): Payer: Self-pay | Admitting: Family Medicine

## 2022-08-31 ENCOUNTER — Ambulatory Visit (INDEPENDENT_AMBULATORY_CARE_PROVIDER_SITE_OTHER): Payer: BC Managed Care – PPO | Admitting: Family Medicine

## 2022-08-31 DIAGNOSIS — F419 Anxiety disorder, unspecified: Secondary | ICD-10-CM | POA: Diagnosis not present

## 2022-08-31 DIAGNOSIS — I10 Essential (primary) hypertension: Secondary | ICD-10-CM

## 2022-08-31 DIAGNOSIS — R569 Unspecified convulsions: Secondary | ICD-10-CM | POA: Diagnosis not present

## 2022-08-31 DIAGNOSIS — F32A Depression, unspecified: Secondary | ICD-10-CM | POA: Diagnosis not present

## 2022-08-31 MED ORDER — AMLODIPINE BESYLATE 5 MG PO TABS: 5.0000 mg | ORAL_TABLET | Freq: Every day | ORAL | 1 refills | Status: AC

## 2022-08-31 MED ORDER — ROSUVASTATIN CALCIUM 20 MG PO TABS: 20.0000 mg | ORAL_TABLET | Freq: Every day | ORAL | 1 refills | Status: AC

## 2022-08-31 MED ORDER — ESCITALOPRAM OXALATE 10 MG PO TABS: 10.0000 mg | ORAL_TABLET | Freq: Every day | ORAL | 1 refills | Status: AC

## 2022-08-31 MED ORDER — LIDOCAINE VISCOUS HCL 2 % MT SOLN: 15.0000 mL | Freq: Four times a day (QID) | OROMUCOSAL | 0 refills | Status: AC | PRN

## 2022-08-31 NOTE — Patient Instructions (Signed)

## 2022-08-31 NOTE — Progress Notes (Unsigned)
    Procedures performed today:    None.  Independent interpretation of notes and tests performed by another provider:   None.  Brief History, Exam, Impression, and Recommendations:    BP (!) 136/103   Pulse 97   Ht 5\' 9"  (1.753 m)   Wt 148 lb (67.1 kg)   SpO2 100%   BMI 21.86 kg/m   Seizure (HCC) Patient was seen in the emergency department recently for reported seizure. Patient was walking to the store when he collapsed on the sidewalk.  He indicates having loss of consciousness and reportedly bystanders, diffuse shaking which lasted for approximately 2 minutes.  EMS was activated and he was brought to the emergency department for further evaluation.  He was noted to have multiple facial lacerations, also had tongue laceration.  At time of discharge from emergency department, he was provided with benzodiazepine taper.  He has not had any further seizure activity since the emergency department discharge which was about 4 days ago. Patient has had prior evaluation with neurology earlier this year with discussion of initiating antiepileptic medication at that time, however he elected to proceed without this. On exam today, patient is in no acute distress, vital signs are stable.  He does have healing facial lacerations as well as localized bruising.  Most notably, he does have a large wound to the left side of tongue which does appear to be the most problematic area for patient at this time.  Given his obvious discomfort as well as the extent of the wound, I do recommend further evaluation with dental specialist, would eventually have evaluation with his dentist to determine appropriateness of meeting with dental specialist such as oral surgeon for management of wound.  He indicates that his dentist continues to defer evaluation to his PCP and was not willing to see him.  I did provide him a letter stating that he needs evaluation with his dentist and possible consideration for referral to oral  surgeon Discussed following up with neurologist given recurrent seizure activity.  Unfortunately, he indicates that he will be leaving the country in the near future and will be abroad in [place] for an estimated 12 to 18 months.  Discussed that unfortunately, with his recurrence of seizure, there is increased risk of having seizure activity without proper therapy.  I did recommend evaluation with neurology, however he indicates that he does not anticipate scheduling follow-up due to leaving soon  Primary hypertension Systolic blood pressure at goal, diastolic is above goal.  Patient does have acute issue as above which could also be contributing to the slight elevation in he is requesting refill of medication today, with pharmacy he is with chest pain or headaches Recommend intermittent monitoring of blood pressure at home, DASH diet  Anxiety and depression Currently managing with use of Lexapro.  Has also been utilizing trazodone to assist with sleep which unfortunately has not been as beneficial as hoped.  He is requesting refill of medications today as Lexapro has been helping to control underlying anxiety/depression.  Refill sent to pharmacy on file   ___________________________________________ Albert Fayson de [place], MD, ABFM, [facility] [place]

## 2022-09-02 DIAGNOSIS — R569 Unspecified convulsions: Secondary | ICD-10-CM | POA: Insufficient documentation

## 2022-09-02 NOTE — Assessment & Plan Note (Signed)
Currently managing with use of Lexapro.  Has also been utilizing trazodone to assist with sleep which unfortunately has not been as beneficial as hoped.  He is requesting refill of medications today as Lexapro has been helping to control underlying anxiety/depression.  Refill sent to pharmacy on file

## 2022-09-02 NOTE — Assessment & Plan Note (Signed)
Systolic blood pressure at goal, diastolic is above goal.  Patient does have acute issue as above which could also be contributing to the slight elevation in he is requesting refill of medication today, with pharmacy he is with chest pain or headaches Recommend intermittent monitoring of blood pressure at home, DASH diet

## 2022-09-02 NOTE — Assessment & Plan Note (Signed)
Patient was seen in the emergency department recently for reported seizure. Patient was walking to the store when he collapsed on the sidewalk.  He indicates having loss of consciousness and reportedly bystanders, diffuse shaking which lasted for approximately 2 minutes.  EMS was activated and he was brought to the emergency department for further evaluation.  He was noted to have multiple facial lacerations, also had tongue laceration.  At time of discharge from emergency department, he was provided with benzodiazepine taper.  He has not had any further seizure activity since the emergency department discharge which was about 4 days ago. Patient has had prior evaluation with neurology earlier this year with discussion of initiating antiepileptic medication at that time, however he elected to proceed without this. On exam today, patient is in no acute distress, vital signs are stable.  He does have healing facial lacerations as well as localized bruising.  Most notably, he does have a large wound to the left side of tongue which does appear to be the most problematic area for patient at this time.  Given his obvious discomfort as well as the extent of the wound, I do recommend further evaluation with dental specialist, would eventually have evaluation with his dentist to determine appropriateness of meeting with dental specialist such as oral surgeon for management of wound.  He indicates that his dentist continues to defer evaluation to his PCP and was not willing to see him.  I did provide him a letter stating that he needs evaluation with his dentist and possible consideration for referral to oral surgeon Discussed following up with neurologist given recurrent seizure activity.  Unfortunately, he indicates that he will be leaving the country in the near future and will be abroad in [place] for an estimated 12 to 18 months.  Discussed that unfortunately, with his recurrence of seizure, there is increased risk  of having seizure activity without proper therapy.  I did recommend evaluation with neurology, however he indicates that he does not anticipate scheduling follow-up due to leaving soon
